# Patient Record
Sex: Female | Born: 1985 | Race: Black or African American | Hispanic: No | Marital: Single | Smoking: Never smoker
Health system: Southern US, Community
[De-identification: ages and names within clinical notes are randomized; demographics above are authoritative.]

## PROBLEM LIST (undated history)

## (undated) DIAGNOSIS — J45909 Unspecified asthma, uncomplicated: Secondary | ICD-10-CM

## (undated) DIAGNOSIS — N83299 Other ovarian cyst, unspecified side: Secondary | ICD-10-CM

## (undated) DIAGNOSIS — O43199 Other malformation of placenta, unspecified trimester: Secondary | ICD-10-CM

## (undated) HISTORY — DX: Other ovarian cyst, unspecified side: N83.299

## (undated) HISTORY — DX: Other malformation of placenta, unspecified trimester: O43.199

---

## 2016-07-17 ENCOUNTER — Emergency Department (HOSPITAL_COMMUNITY)
Admission: EM | Admit: 2016-07-17 | Discharge: 2016-07-17 | Disposition: A | Discharge status: Home / Self Care | Payer: Self-pay | Attending: Emergency Medicine | Admitting: Emergency Medicine

## 2016-07-17 ENCOUNTER — Emergency Department (HOSPITAL_COMMUNITY): Payer: Self-pay

## 2016-07-17 ENCOUNTER — Encounter (HOSPITAL_COMMUNITY): Payer: Self-pay | Admitting: Emergency Medicine

## 2016-07-17 DIAGNOSIS — J45909 Unspecified asthma, uncomplicated: Secondary | ICD-10-CM | POA: Insufficient documentation

## 2016-07-17 DIAGNOSIS — R0789 Other chest pain: Secondary | ICD-10-CM | POA: Insufficient documentation

## 2016-07-17 HISTORY — DX: Unspecified asthma, uncomplicated: J45.909

## 2016-07-17 LAB — BASIC METABOLIC PANEL
Anion gap: 8 (ref 5–15)
BUN: 10 mg/dL (ref 6–20)
CO2: 26 mmol/L (ref 22–32)
CREATININE: 0.64 mg/dL (ref 0.44–1.00)
Calcium: 9.4 mg/dL (ref 8.9–10.3)
Chloride: 102 mmol/L (ref 101–111)
GFR calc Af Amer: >60 mL/min (ref >60)
Glucose, Bld: 94 mg/dL (ref 65–99)
POTASSIUM: 3.9 mmol/L (ref 3.5–5.1)
SODIUM: 136 mmol/L (ref 135–145)

## 2016-07-17 LAB — CBC
HCT: 38.6 % (ref 36.0–46.0)
Hemoglobin: 12.8 g/dL (ref 12.0–15.0)
MCH: 29.9 pg (ref 26.0–34.0)
MCHC: 33.2 g/dL (ref 30.0–36.0)
MCV: 90.2 fL (ref 78.0–100.0)
PLATELETS: 414 10*3/uL — AB (ref 150–400)
RBC: 4.28 MIL/uL (ref 3.87–5.11)
RDW: 12.6 % (ref 11.5–15.5)
WBC: 4.6 10*3/uL (ref 4.0–10.5)

## 2016-07-17 LAB — D-DIMER, QUANTITATIVE (NOT AT ARMC)

## 2016-07-17 LAB — I-STAT TROPONIN, ED: TROPONIN I, POC: 0.02 ng/mL (ref 0.00–0.08)

## 2016-07-17 MED ORDER — OMEPRAZOLE 20 MG PO CPDR: 20.0000 mg | DELAYED_RELEASE_CAPSULE | Freq: Two times a day (BID) | ORAL | 0 refills | Status: DC

## 2016-07-17 NOTE — ED Provider Notes (Signed)
[facility]-EMERGENCY DEPT Provider Note   CSN: [id number] Arrival date & time: 07/17/16  1052     History   Chief Complaint Chief Complaint  Patient presents with  . Chest Pain    HPI Isabella Stevens is a 30 y.o. female.  The history is provided by the patient. No language interpreter was used.  Chest Pain     Isabella Stevens is a 30 y.o. female who presents to the Emergency Department complaining of chest pain.  She reports 3 weeks of left-sided chest pain. The pain is waxing and waning with no clear alleviating factors and no triggers. Over the last few days her pain has been more intense and occurring more frequently and she has associated shortness of breath along with the pain for the last 2 days. She denies any fevers, cough, abdominal pain, nausea, vomiting, diarrhea, leg swelling or pain. 3 weeks ago she did have an IUD removed. Past Medical History:  Diagnosis Date  . Asthma     There are no active problems to display for this patient.   History reviewed. No pertinent surgical history.  OB History    No data available       Home Medications    Prior to Admission medications   Medication Sig Start Date End Date Taking? Authorizing Provider  omeprazole (PRILOSEC) 20 MG capsule Take 1 capsule (20 mg total) by mouth 2 (two) times daily before a meal. 07/17/16   Tilden Fossa, MD    Family History History reviewed. No pertinent family history.  Social History Social History  Substance Use Topics  . Smoking status: Never Smoker  . Smokeless tobacco: Never Used  . Alcohol use Yes     Comment: socially     Allergies   Sulfa antibiotics   Review of Systems Review of Systems  Cardiovascular: Positive for chest pain.  All other systems reviewed and are negative.    Physical Exam Updated Vital Signs BP 120/70   Pulse 80   Temp 98.6 F (37 C) (Oral)   Resp 16   Ht 5\' 2"  (1.575 m)   Wt 220 lb (99.8 kg)   SpO2 100%   BMI 40.24 kg/m   Physical Exam    Constitutional: She is oriented to person, place, and time. She appears well-developed and well-nourished.  HENT:  Head: Normocephalic and atraumatic.  Cardiovascular: Normal rate and regular rhythm.   No murmur heard. Pulmonary/Chest: Effort normal and breath sounds normal. No respiratory distress. She exhibits no tenderness.  Abdominal: Soft. There is no tenderness. There is no rebound and no guarding.  Musculoskeletal: She exhibits no edema or tenderness.  Neurological: She is alert and oriented to person, place, and time.  Skin: Skin is warm and dry.  Psychiatric: She has a normal mood and affect. Her behavior is normal.  Nursing note and vitals reviewed.    ED Treatments / Results  Labs (all labs ordered are listed, but only abnormal results are displayed) Labs Reviewed  CBC - Abnormal; Notable for the following:       Result Value   Platelets 414 (*)    All other components within normal limits  BASIC METABOLIC PANEL  D-DIMER, QUANTITATIVE (NOT AT [facility])  I-STAT TROPOININ, ED    EKG  EKG Interpretation  Date/Time:  Saturday July 17 2016 10:59:06 EDT Ventricular Rate:  90 PR Interval:  132 QRS Duration: 82 QT Interval:  356 QTC Calculation: 435 R Axis:   55 Text Interpretation:  Normal sinus rhythm  Normal ECG Confirmed by Lincoln Brighamees, Liz [phone]([id number]) on 07/17/2016 11:12:49 AM Also confirmed by Lincoln Brighamees, Liz [phone]([id number]), editor [place]  CCT, BEVERLY ([id number])  on 07/17/2016 12:24:27 PM       Radiology Dg Chest 2 View  Result Date: 07/17/2016 CLINICAL DATA:  Left upper chest pain. EXAM: CHEST  2 VIEW COMPARISON:  None. FINDINGS: The heart size and mediastinal contours are within normal limits. Both lungs are clear. The visualized skeletal structures are unremarkable. IMPRESSION: No active cardiopulmonary disease. Electronically Signed   By: Gerome Samavid  Williams III M.D   On: 07/17/2016 12:08    Procedures Procedures (including critical care time)  Medications Ordered in  ED Medications - No data to display   Initial Impression / Assessment and Plan / ED Course  I have reviewed the triage vital signs and the nursing notes.  Pertinent labs & imaging results that were available during my care of the patient were reviewed by me and considered in my medical decision making (see chart for details).  Clinical Course    Patient here for evaluation of progressive left-sided chest pain. Pain is not reproducible on examination. Presentation is not consistent with ACS, PE, dissection. Discussed with patient's unclear etiology of pain but will treat for potential reflux. Discussed home care, outpatient follow-up, return precautions.  Final Clinical Impressions(s) / ED Diagnoses   Final diagnoses:  Atypical chest pain    New Prescriptions Discharge Medication List as of 07/17/2016  1:31 PM    START taking these medications   Details  omeprazole (PRILOSEC) 20 MG capsule Take 1 capsule (20 mg total) by mouth 2 (two) times daily before a meal., Starting Sat 07/17/2016, Print         Tilden FossaElizabeth Abdoul Encinas, MD 07/17/16 1724

## 2016-07-17 NOTE — ED Notes (Signed)
Pt getting dressed and discharge vitals in.  

## 2016-07-17 NOTE — ED Notes (Signed)
Pt transported to xray by Larrie KassMaurice Load holt

## 2016-07-17 NOTE — ED Triage Notes (Signed)
Pt states she has been having CP for the past 3 days. Today she has felt SOB as well and the CP has increased. Pt states the pain is in her shoulder, armpit and under her left breast.

## 2019-11-30 LAB — OB RESULTS CONSOLE ABO/RH: RH Type: POSITIVE

## 2019-11-30 LAB — OB RESULTS CONSOLE ANTIBODY SCREEN: Antibody Screen: NEGATIVE

## 2019-11-30 LAB — OB RESULTS CONSOLE RUBELLA ANTIBODY, IGM: Rubella: IMMUNE

## 2019-11-30 LAB — OB RESULTS CONSOLE HIV ANTIBODY (ROUTINE TESTING): HIV: NONREACTIVE

## 2019-11-30 LAB — OB RESULTS CONSOLE HEPATITIS B SURFACE ANTIGEN: Hepatitis B Surface Ag: NEGATIVE

## 2019-11-30 LAB — OB RESULTS CONSOLE RPR: RPR: NONREACTIVE

## 2020-05-23 LAB — OB RESULTS CONSOLE GBS: GBS: NEGATIVE

## 2020-06-10 ENCOUNTER — Other Ambulatory Visit: Payer: Self-pay

## 2020-06-10 ENCOUNTER — Encounter (HOSPITAL_COMMUNITY): Payer: Self-pay | Admitting: *Deleted

## 2020-06-10 ENCOUNTER — Telehealth (HOSPITAL_COMMUNITY): Payer: Self-pay | Admitting: *Deleted

## 2020-06-10 ENCOUNTER — Encounter (HOSPITAL_COMMUNITY): Payer: Self-pay | Admitting: Obstetrics and Gynecology

## 2020-06-10 ENCOUNTER — Inpatient Hospital Stay (HOSPITAL_COMMUNITY)
Admission: AD | Admit: 2020-06-10 | Discharge: 2020-06-14 | DRG: 787 | Disposition: A | Discharge status: Home / Self Care | Payer: 59 | Attending: Obstetrics and Gynecology | Admitting: Obstetrics and Gynecology

## 2020-06-10 DIAGNOSIS — Z98891 History of uterine scar from previous surgery: Secondary | ICD-10-CM

## 2020-06-10 DIAGNOSIS — D62 Acute posthemorrhagic anemia: Secondary | ICD-10-CM | POA: Diagnosis not present

## 2020-06-10 DIAGNOSIS — Z3A38 38 weeks gestation of pregnancy: Secondary | ICD-10-CM | POA: Diagnosis not present

## 2020-06-10 DIAGNOSIS — R030 Elevated blood-pressure reading, without diagnosis of hypertension: Secondary | ICD-10-CM | POA: Diagnosis present

## 2020-06-10 DIAGNOSIS — O3483 Maternal care for other abnormalities of pelvic organs, third trimester: Secondary | ICD-10-CM | POA: Diagnosis present

## 2020-06-10 DIAGNOSIS — O4103X0 Oligohydramnios, third trimester, not applicable or unspecified: Principal | ICD-10-CM | POA: Diagnosis present

## 2020-06-10 DIAGNOSIS — Z20822 Contact with and (suspected) exposure to covid-19: Secondary | ICD-10-CM | POA: Diagnosis present

## 2020-06-10 DIAGNOSIS — O358XX0 Maternal care for other (suspected) fetal abnormality and damage, not applicable or unspecified: Secondary | ICD-10-CM | POA: Diagnosis present

## 2020-06-10 DIAGNOSIS — N83209 Unspecified ovarian cyst, unspecified side: Secondary | ICD-10-CM | POA: Diagnosis present

## 2020-06-10 DIAGNOSIS — O4100X0 Oligohydramnios, unspecified trimester, not applicable or unspecified: Secondary | ICD-10-CM

## 2020-06-10 DIAGNOSIS — O99893 Other specified diseases and conditions complicating puerperium: Secondary | ICD-10-CM | POA: Diagnosis present

## 2020-06-10 DIAGNOSIS — O9081 Anemia of the puerperium: Secondary | ICD-10-CM | POA: Diagnosis not present

## 2020-06-10 DIAGNOSIS — Z3490 Encounter for supervision of normal pregnancy, unspecified, unspecified trimester: Secondary | ICD-10-CM

## 2020-06-10 LAB — CBC
HCT: 32.7 % — ABNORMAL LOW (ref 36.0–46.0)
Hemoglobin: 10.8 g/dL — ABNORMAL LOW (ref 12.0–15.0)
MCH: 30.9 pg (ref 26.0–34.0)
MCHC: 33 g/dL (ref 30.0–36.0)
MCV: 93.4 fL (ref 80.0–100.0)
Platelets: 376 10*3/uL (ref 150–400)
RBC: 3.5 MIL/uL — ABNORMAL LOW (ref 3.87–5.11)
RDW: 13.3 % (ref 11.5–15.5)
WBC: 6.8 10*3/uL (ref 4.0–10.5)
nRBC: 0 % (ref 0.0–0.2)

## 2020-06-10 LAB — TYPE AND SCREEN
ABO/RH(D): O POS
Antibody Screen: NEGATIVE

## 2020-06-10 LAB — SARS CORONAVIRUS 2 BY RT PCR (HOSPITAL ORDER, PERFORMED IN ~~LOC~~ HOSPITAL LAB): SARS Coronavirus 2: NEGATIVE

## 2020-06-10 MED ORDER — ACETAMINOPHEN 325 MG PO TABS: 650.0000 mg | ORAL_TABLET | ORAL | Status: DC | PRN

## 2020-06-10 MED ORDER — LIDOCAINE HCL (PF) 1 % IJ SOLN: 30.0000 mL | INTRAMUSCULAR | Status: DC | PRN

## 2020-06-10 MED ORDER — FLEET ENEMA 7-19 GM/118ML RE ENEM: 1.0000 | ENEMA | RECTAL | Status: DC | PRN

## 2020-06-10 MED ORDER — MISOPROSTOL 100 MCG PO TABS
25.0000 ug | ORAL_TABLET | ORAL | Status: DC
  Administered 2020-06-10 – 2020-06-11 (×3): 25 ug via VAGINAL
  Filled 2020-06-10 (×3): qty 1

## 2020-06-10 MED ORDER — SOD CITRATE-CITRIC ACID 500-334 MG/5ML PO SOLN
30.0000 mL | ORAL | Status: DC | PRN
  Administered 2020-06-11: 30 mL via ORAL
  Filled 2020-06-10: qty 30

## 2020-06-10 MED ORDER — LACTATED RINGERS IV SOLN
500.0000 mL | INTRAVENOUS | Status: DC | PRN
  Administered 2020-06-10: 250 mL via INTRAVENOUS

## 2020-06-10 MED ORDER — ONDANSETRON HCL 4 MG/2ML IJ SOLN: 4.0000 mg | Freq: Four times a day (QID) | INTRAMUSCULAR | Status: DC | PRN

## 2020-06-10 MED ORDER — LACTATED RINGERS IV SOLN: INTRAVENOUS | Status: DC

## 2020-06-10 MED ORDER — OXYCODONE-ACETAMINOPHEN 5-325 MG PO TABS: 1.0000 | ORAL_TABLET | ORAL | Status: DC | PRN

## 2020-06-10 MED ORDER — FENTANYL CITRATE (PF) 100 MCG/2ML IJ SOLN
100.0000 ug | INTRAMUSCULAR | Status: DC | PRN
  Administered 2020-06-11 (×3): 100 ug via INTRAVENOUS
  Filled 2020-06-10 (×3): qty 2

## 2020-06-10 MED ORDER — OXYCODONE-ACETAMINOPHEN 5-325 MG PO TABS: 2.0000 | ORAL_TABLET | ORAL | Status: DC | PRN

## 2020-06-10 MED ORDER — OXYTOCIN BOLUS FROM INFUSION: 333.0000 mL | Freq: Once | INTRAVENOUS | Status: DC

## 2020-06-10 MED ORDER — OXYTOCIN-SODIUM CHLORIDE 30-0.9 UT/500ML-% IV SOLN
2.5000 [IU]/h | INTRAVENOUS | Status: DC
  Filled 2020-06-10: qty 500

## 2020-06-10 NOTE — H&P (Signed)
Isabella Stevens is a 34 y.o. female presenting for IOL for oligo afi 3 today in office. OB History    Gravida  1   Para      Term      Preterm      AB      Living        SAB      TAB      Ectopic      Multiple      Live Births             Past Medical History:  Diagnosis Date  . Complex ovarian cyst   . Marginal insertion of umbilical cord affecting management of mother    History reviewed. No pertinent surgical history. Family History: family history includes Anxiety disorder in her mother; Arthritis in her mother; Cancer in her maternal grandmother; Diabetes in her maternal aunt; Heart disease in her maternal aunt; Hypertension in her maternal aunt and mother; Kidney disease in her maternal aunt; Obesity in her maternal aunt and mother; Stroke in her maternal grandmother. Social History:  reports that she has never smoked. She has never used smokeless tobacco. She reports current alcohol use. She reports that she does not use drugs.     Maternal Diabetes: No Genetic Screening: Normal Maternal Ultrasounds/Referrals: Fetal renal pyelectasis Fetal Ultrasounds or other Referrals:  None Maternal Substance Abuse:  No Significant Maternal Medications:  None Significant Maternal Lab Results:  Group B Strep negative Other Comments:  None  Review of Systems  Constitutional: Negative.   All other systems reviewed and are negative.  Maternal Medical History:  Fetal activity: Perceived fetal activity is decreased.   Last perceived fetal movement was within the past 12 hours.    Prenatal complications: Oligohydramnios.   Prenatal Complications - Diabetes: none.    Dilation: 1 Effacement (%): 30 Station: -3 Exam by:: Jacqulyn Liner, RN Blood pressure (!) 125/53, pulse 82, temperature 98.5 F (36.9 C), temperature source Oral, resp. rate 16. Maternal Exam:  Uterine Assessment: Contraction strength is mild.  Contraction frequency is irregular.   Abdomen: Patient  reports no abdominal tenderness. Fetal presentation: vertex  Introitus: Normal vulva. Normal vagina.  Ferning test: not done.  Nitrazine test: not done. Amniotic fluid character: not assessed.  Pelvis: adequate for delivery.   Cervix: Cervix evaluated by digital exam.     Physical Exam Constitutional:      Appearance: Normal appearance.  HENT:     Head: Normocephalic and atraumatic.  Cardiovascular:     Rate and Rhythm: Normal rate and regular rhythm.     Pulses: Normal pulses.     Heart sounds: Normal heart sounds.  Pulmonary:     Effort: Pulmonary effort is normal.     Breath sounds: Normal breath sounds.  Abdominal:     Palpations: Abdomen is soft.  Genitourinary:    General: Normal vulva.  Musculoskeletal:        General: Normal range of motion.     Cervical back: Normal range of motion and neck supple.  Skin:    General: Skin is warm and dry.  Neurological:     General: No focal deficit present.     Mental Status: She is alert and oriented to person, place, and time.  Psychiatric:        Mood and Affect: Mood normal.        Behavior: Behavior normal.     Prenatal labs: ABO, Rh: --/--/O POS (09/07 1840) Antibody: NEG (09/07 1840)  Rubella: Immune (02/26 0000) RPR: Nonreactive (02/26 0000)  HBsAg: Negative (02/26 0000)  HIV: Non-reactive (02/26 0000)  GBS: Negative/-- (08/20 0000)   Assessment/Plan: 38+ week IUP Oligohydramnios Left fetal pyelectasis IOL Neonatal renal fu FU sono for maternal ov cyst  Tierre Gerard J 06/10/2020, 8:36 PM

## 2020-06-10 NOTE — Telephone Encounter (Signed)
Preadmission screen  

## 2020-06-11 ENCOUNTER — Encounter (HOSPITAL_COMMUNITY)
Admission: AD | Disposition: A | Discharge status: Home / Self Care | Payer: Self-pay | Source: Home / Self Care | Attending: Obstetrics and Gynecology

## 2020-06-11 ENCOUNTER — Inpatient Hospital Stay (HOSPITAL_COMMUNITY): Payer: 59 | Admitting: Anesthesiology

## 2020-06-11 DIAGNOSIS — O4100X0 Oligohydramnios, unspecified trimester, not applicable or unspecified: Secondary | ICD-10-CM

## 2020-06-11 DIAGNOSIS — Z3490 Encounter for supervision of normal pregnancy, unspecified, unspecified trimester: Secondary | ICD-10-CM

## 2020-06-11 DIAGNOSIS — Z98891 History of uterine scar from previous surgery: Secondary | ICD-10-CM

## 2020-06-11 LAB — RPR: RPR Ser Ql: NONREACTIVE

## 2020-06-11 SURGERY — Surgical Case
Anesthesia: Epidural

## 2020-06-11 MED ORDER — SODIUM CHLORIDE (PF) 0.9 % IJ SOLN
INTRAMUSCULAR | Status: DC | PRN
Start: 2020-06-11 — End: 2020-06-11
  Administered 2020-06-11: 12 mL/h via EPIDURAL

## 2020-06-11 MED ORDER — SCOPOLAMINE 1 MG/3DAYS TD PT72: 1.0000 | MEDICATED_PATCH | Freq: Once | TRANSDERMAL | Status: DC

## 2020-06-11 MED ORDER — OXYTOCIN-SODIUM CHLORIDE 30-0.9 UT/500ML-% IV SOLN: 2.5000 [IU]/h | INTRAVENOUS | Status: AC

## 2020-06-11 MED ORDER — NALOXONE HCL 4 MG/10ML IJ SOLN
1.0000 ug/kg/h | INTRAVENOUS | Status: DC | PRN
  Filled 2020-06-11: qty 5

## 2020-06-11 MED ORDER — DIPHENHYDRAMINE HCL 50 MG/ML IJ SOLN: 12.5000 mg | INTRAMUSCULAR | Status: DC | PRN

## 2020-06-11 MED ORDER — SCOPOLAMINE 1 MG/3DAYS TD PT72
MEDICATED_PATCH | TRANSDERMAL | Status: DC | PRN
  Administered 2020-06-11: 1 via TRANSDERMAL

## 2020-06-11 MED ORDER — MEPERIDINE HCL 25 MG/ML IJ SOLN
INTRAMUSCULAR | Status: AC
  Filled 2020-06-11: qty 2

## 2020-06-11 MED ORDER — COCONUT OIL OIL
1.0000 "application " | TOPICAL_OIL | Status: DC | PRN
  Administered 2020-06-11: 1 via TOPICAL

## 2020-06-11 MED ORDER — MENTHOL 3 MG MT LOZG: 1.0000 | LOZENGE | OROMUCOSAL | Status: DC | PRN

## 2020-06-11 MED ORDER — DEXTROSE 5 % IV SOLN
INTRAVENOUS | Status: DC | PRN
  Administered 2020-06-11: 3 g via INTRAVENOUS

## 2020-06-11 MED ORDER — KETOROLAC TROMETHAMINE 30 MG/ML IJ SOLN
30.0000 mg | Freq: Four times a day (QID) | INTRAMUSCULAR | Status: AC | PRN
  Administered 2020-06-11: 30 mg via INTRAMUSCULAR

## 2020-06-11 MED ORDER — DIPHENHYDRAMINE HCL 25 MG PO CAPS
25.0000 mg | ORAL_CAPSULE | ORAL | Status: DC | PRN
  Administered 2020-06-12 (×3): 25 mg via ORAL
  Filled 2020-06-11 (×2): qty 1

## 2020-06-11 MED ORDER — PHENYLEPHRINE 40 MCG/ML (10ML) SYRINGE FOR IV PUSH (FOR BLOOD PRESSURE SUPPORT): 80.0000 ug | PREFILLED_SYRINGE | INTRAVENOUS | Status: DC | PRN

## 2020-06-11 MED ORDER — LIDOCAINE HCL (PF) 1 % IJ SOLN
INTRAMUSCULAR | Status: DC | PRN
  Administered 2020-06-11: 5 mL via EPIDURAL

## 2020-06-11 MED ORDER — MORPHINE SULFATE (PF) 0.5 MG/ML IJ SOLN
INTRAMUSCULAR | Status: AC
  Filled 2020-06-11: qty 10

## 2020-06-11 MED ORDER — SIMETHICONE 80 MG PO CHEW
80.0000 mg | CHEWABLE_TABLET | Freq: Three times a day (TID) | ORAL | Status: DC
  Administered 2020-06-12 – 2020-06-14 (×7): 80 mg via ORAL
  Filled 2020-06-11 (×7): qty 1

## 2020-06-11 MED ORDER — LACTATED RINGERS IV SOLN
500.0000 mL | Freq: Once | INTRAVENOUS | Status: AC
  Administered 2020-06-11: 500 mL via INTRAVENOUS

## 2020-06-11 MED ORDER — IBUPROFEN 800 MG PO TABS
800.0000 mg | ORAL_TABLET | Freq: Three times a day (TID) | ORAL | Status: DC
  Administered 2020-06-12 – 2020-06-14 (×5): 800 mg via ORAL
  Filled 2020-06-11 (×5): qty 1

## 2020-06-11 MED ORDER — LABETALOL HCL 5 MG/ML IV SOLN
20.0000 mg | Freq: Once | INTRAVENOUS | Status: AC
  Administered 2020-06-11: 20 mg via INTRAVENOUS

## 2020-06-11 MED ORDER — NALBUPHINE HCL 10 MG/ML IJ SOLN: 5.0000 mg | INTRAMUSCULAR | Status: DC | PRN

## 2020-06-11 MED ORDER — TERBUTALINE SULFATE 1 MG/ML IJ SOLN: 0.2500 mg | Freq: Once | INTRAMUSCULAR | Status: DC | PRN

## 2020-06-11 MED ORDER — DEXAMETHASONE SODIUM PHOSPHATE 4 MG/ML IJ SOLN
INTRAMUSCULAR | Status: AC
  Filled 2020-06-11: qty 1

## 2020-06-11 MED ORDER — WITCH HAZEL-GLYCERIN EX PADS: 1.0000 "application " | MEDICATED_PAD | CUTANEOUS | Status: DC | PRN

## 2020-06-11 MED ORDER — KETOROLAC TROMETHAMINE 30 MG/ML IJ SOLN
INTRAMUSCULAR | Status: AC
  Filled 2020-06-11: qty 1

## 2020-06-11 MED ORDER — MEPERIDINE HCL 25 MG/ML IJ SOLN
INTRAMUSCULAR | Status: DC | PRN
Start: 2020-06-11 — End: 2020-06-11
  Administered 2020-06-11: 12.5 mg via INTRAVENOUS

## 2020-06-11 MED ORDER — PRENATAL MULTIVITAMIN CH
1.0000 | ORAL_TABLET | Freq: Every day | ORAL | Status: DC
  Administered 2020-06-12 – 2020-06-13 (×2): 1 via ORAL
  Filled 2020-06-11 (×2): qty 1

## 2020-06-11 MED ORDER — ACETAMINOPHEN 500 MG PO TABS
1000.0000 mg | ORAL_TABLET | Freq: Four times a day (QID) | ORAL | Status: AC
  Administered 2020-06-12 (×2): 1000 mg via ORAL
  Filled 2020-06-11 (×2): qty 2

## 2020-06-11 MED ORDER — KETOROLAC TROMETHAMINE 30 MG/ML IJ SOLN
30.0000 mg | Freq: Four times a day (QID) | INTRAMUSCULAR | Status: AC | PRN
  Administered 2020-06-12 (×2): 30 mg via INTRAVENOUS
  Filled 2020-06-11 (×2): qty 1

## 2020-06-11 MED ORDER — OXYTOCIN-SODIUM CHLORIDE 30-0.9 UT/500ML-% IV SOLN
1.0000 m[IU]/min | INTRAVENOUS | Status: DC
  Administered 2020-06-11: 2 m[IU]/min via INTRAVENOUS
  Administered 2020-06-11: 30 [IU] via INTRAVENOUS
  Administered 2020-06-11: 10 m[IU]/min via INTRAVENOUS

## 2020-06-11 MED ORDER — IBUPROFEN 800 MG PO TABS: 800.0000 mg | ORAL_TABLET | Freq: Three times a day (TID) | ORAL | Status: DC

## 2020-06-11 MED ORDER — METOCLOPRAMIDE HCL 5 MG/ML IJ SOLN
INTRAMUSCULAR | Status: AC
  Filled 2020-06-11: qty 2

## 2020-06-11 MED ORDER — SODIUM CHLORIDE 0.9% FLUSH: 3.0000 mL | INTRAVENOUS | Status: DC | PRN

## 2020-06-11 MED ORDER — ONDANSETRON HCL 4 MG/2ML IJ SOLN
4.0000 mg | Freq: Three times a day (TID) | INTRAMUSCULAR | Status: DC | PRN
  Administered 2020-06-11: 4 mg via INTRAVENOUS
  Filled 2020-06-11: qty 2

## 2020-06-11 MED ORDER — NALBUPHINE HCL 10 MG/ML IJ SOLN: 5.0000 mg | Freq: Once | INTRAMUSCULAR | Status: DC | PRN

## 2020-06-11 MED ORDER — LACTATED RINGERS IV SOLN: INTRAVENOUS | Status: DC

## 2020-06-11 MED ORDER — NALOXONE HCL 0.4 MG/ML IJ SOLN: 0.4000 mg | INTRAMUSCULAR | Status: DC | PRN

## 2020-06-11 MED ORDER — SIMETHICONE 80 MG PO CHEW
80.0000 mg | CHEWABLE_TABLET | ORAL | Status: DC
  Administered 2020-06-12 – 2020-06-13 (×2): 80 mg via ORAL
  Filled 2020-06-11 (×2): qty 1

## 2020-06-11 MED ORDER — SENNOSIDES-DOCUSATE SODIUM 8.6-50 MG PO TABS
2.0000 | ORAL_TABLET | ORAL | Status: DC
  Administered 2020-06-12 – 2020-06-13 (×2): 2 via ORAL
  Filled 2020-06-11 (×2): qty 2

## 2020-06-11 MED ORDER — DIPHENHYDRAMINE HCL 25 MG PO CAPS
25.0000 mg | ORAL_CAPSULE | Freq: Four times a day (QID) | ORAL | Status: DC | PRN
  Filled 2020-06-11: qty 1

## 2020-06-11 MED ORDER — SCOPOLAMINE 1 MG/3DAYS TD PT72
MEDICATED_PATCH | TRANSDERMAL | Status: AC
  Filled 2020-06-11: qty 1

## 2020-06-11 MED ORDER — LIDOCAINE HCL (PF) 2 % IJ SOLN
INTRAMUSCULAR | Status: AC
  Filled 2020-06-11: qty 20

## 2020-06-11 MED ORDER — EPHEDRINE 5 MG/ML INJ: 10.0000 mg | INTRAVENOUS | Status: DC | PRN

## 2020-06-11 MED ORDER — PROMETHAZINE HCL 25 MG/ML IJ SOLN: 6.2500 mg | INTRAMUSCULAR | Status: DC | PRN

## 2020-06-11 MED ORDER — FENTANYL-BUPIVACAINE-NACL 0.5-0.125-0.9 MG/250ML-% EP SOLN: 12.0000 mL/h | EPIDURAL | Status: DC | PRN

## 2020-06-11 MED ORDER — MEPERIDINE HCL 25 MG/ML IJ SOLN: 6.2500 mg | INTRAMUSCULAR | Status: DC | PRN

## 2020-06-11 MED ORDER — MORPHINE SULFATE (PF) 0.5 MG/ML IJ SOLN
INTRAMUSCULAR | Status: DC | PRN
Start: 2020-06-11 — End: 2020-06-11
  Administered 2020-06-11: 3 mg via EPIDURAL
  Administered 2020-06-11 (×2): 1 mg via EPIDURAL

## 2020-06-11 MED ORDER — FENTANYL-BUPIVACAINE-NACL 0.5-0.125-0.9 MG/250ML-% EP SOLN
12.0000 mL/h | EPIDURAL | Status: DC | PRN
  Filled 2020-06-11: qty 250

## 2020-06-11 MED ORDER — EPINEPHRINE PF 1 MG/ML IJ SOLN
INTRAMUSCULAR | Status: AC
  Filled 2020-06-11: qty 1

## 2020-06-11 MED ORDER — SODIUM CHLORIDE 0.9 % IV SOLN: INTRAVENOUS | Status: DC | PRN

## 2020-06-11 MED ORDER — DIBUCAINE (PERIANAL) 1 % EX OINT: 1.0000 "application " | TOPICAL_OINTMENT | CUTANEOUS | Status: DC | PRN

## 2020-06-11 MED ORDER — PHENYLEPHRINE HCL (PRESSORS) 10 MG/ML IV SOLN
INTRAVENOUS | Status: DC | PRN
  Administered 2020-06-11: 120 ug via INTRAVENOUS
  Administered 2020-06-11: 80 ug via INTRAVENOUS

## 2020-06-11 MED ORDER — TETANUS-DIPHTH-ACELL PERTUSSIS 5-2.5-18.5 LF-MCG/0.5 IM SUSP: 0.5000 mL | Freq: Once | INTRAMUSCULAR | Status: DC

## 2020-06-11 MED ORDER — OXYCODONE HCL 5 MG PO TABS
5.0000 mg | ORAL_TABLET | ORAL | Status: DC | PRN
  Administered 2020-06-13 (×3): 5 mg via ORAL
  Filled 2020-06-11 (×3): qty 1

## 2020-06-11 MED ORDER — SIMETHICONE 80 MG PO CHEW: 80.0000 mg | CHEWABLE_TABLET | ORAL | Status: DC | PRN

## 2020-06-11 MED ORDER — METOCLOPRAMIDE HCL 5 MG/ML IJ SOLN
INTRAMUSCULAR | Status: DC | PRN
  Administered 2020-06-11: 10 mg via INTRAVENOUS

## 2020-06-11 MED ORDER — LABETALOL HCL 5 MG/ML IV SOLN
INTRAVENOUS | Status: AC
  Filled 2020-06-11: qty 4

## 2020-06-11 MED ORDER — DEXAMETHASONE SODIUM PHOSPHATE 4 MG/ML IJ SOLN
INTRAMUSCULAR | Status: DC | PRN
  Administered 2020-06-11: 4 mg via INTRAVENOUS

## 2020-06-11 MED ORDER — LACTATED RINGERS IV SOLN: 500.0000 mL | Freq: Once | INTRAVENOUS | Status: DC

## 2020-06-11 MED ORDER — NALBUPHINE HCL 10 MG/ML IJ SOLN
5.0000 mg | INTRAMUSCULAR | Status: DC | PRN
  Administered 2020-06-12 (×2): 5 mg via INTRAVENOUS
  Filled 2020-06-11 (×2): qty 1

## 2020-06-11 MED ORDER — ZOLPIDEM TARTRATE 5 MG PO TABS: 5.0000 mg | ORAL_TABLET | Freq: Every evening | ORAL | Status: DC | PRN

## 2020-06-11 MED ORDER — ONDANSETRON HCL 4 MG/2ML IJ SOLN
INTRAMUSCULAR | Status: DC | PRN
  Administered 2020-06-11: 4 mg via INTRAVENOUS

## 2020-06-11 MED ORDER — FENTANYL CITRATE (PF) 100 MCG/2ML IJ SOLN: 25.0000 ug | INTRAMUSCULAR | Status: DC | PRN

## 2020-06-11 SURGICAL SUPPLY — 36 items
BENZOIN TINCTURE PRP APPL 2/3 (GAUZE/BANDAGES/DRESSINGS) ×3 IMPLANT
CHLORAPREP W/TINT 26ML (MISCELLANEOUS) ×3 IMPLANT
CLAMP CORD UMBIL (MISCELLANEOUS) IMPLANT
CLOSURE STERI STRIP 1/2 X4 (GAUZE/BANDAGES/DRESSINGS) ×3 IMPLANT
CLOSURE WOUND 1/2 X4 (GAUZE/BANDAGES/DRESSINGS)
CLOTH BEACON ORANGE TIMEOUT ST (SAFETY) ×3 IMPLANT
DRSG OPSITE POSTOP 4X10 (GAUZE/BANDAGES/DRESSINGS) ×3 IMPLANT
ELECT REM PT RETURN 9FT ADLT (ELECTROSURGICAL) ×3
ELECTRODE REM PT RTRN 9FT ADLT (ELECTROSURGICAL) ×1 IMPLANT
EXTRACTOR VACUUM M CUP 4 TUBE (SUCTIONS) IMPLANT
EXTRACTOR VACUUM M CUP 4' TUBE (SUCTIONS)
GLOVE BIO SURGEON STRL SZ 6.5 (GLOVE) ×2 IMPLANT
GLOVE BIO SURGEONS STRL SZ 6.5 (GLOVE) ×1
GLOVE BIOGEL PI IND STRL 7.0 (GLOVE) ×2 IMPLANT
GLOVE BIOGEL PI INDICATOR 7.0 (GLOVE) ×4
GOWN STRL REUS W/TWL LRG LVL3 (GOWN DISPOSABLE) ×6 IMPLANT
KIT ABG SYR 3ML LUER SLIP (SYRINGE) IMPLANT
NEEDLE HYPO 22GX1.5 SAFETY (NEEDLE) IMPLANT
NEEDLE HYPO 25X5/8 SAFETYGLIDE (NEEDLE) IMPLANT
NS IRRIG 1000ML POUR BTL (IV SOLUTION) ×3 IMPLANT
PACK C SECTION WH (CUSTOM PROCEDURE TRAY) ×3 IMPLANT
PAD OB MATERNITY 4.3X12.25 (PERSONAL CARE ITEMS) ×3 IMPLANT
PENCIL SMOKE EVAC W/HOLSTER (ELECTROSURGICAL) ×3 IMPLANT
STRIP CLOSURE SKIN 1/2X4 (GAUZE/BANDAGES/DRESSINGS) IMPLANT
SUT MON AB 4-0 PS1 27 (SUTURE) ×3 IMPLANT
SUT PLAIN 0 NONE (SUTURE) IMPLANT
SUT PLAIN 2 0 XLH (SUTURE) IMPLANT
SUT VIC AB 0 CT1 36 (SUTURE) ×6 IMPLANT
SUT VIC AB 0 CTX 36 (SUTURE) ×4
SUT VIC AB 0 CTX36XBRD ANBCTRL (SUTURE) ×2 IMPLANT
SUT VIC AB 2-0 CT1 27 (SUTURE) ×2
SUT VIC AB 2-0 CT1 TAPERPNT 27 (SUTURE) ×1 IMPLANT
SYR CONTROL 10ML LL (SYRINGE) IMPLANT
TOWEL OR 17X24 6PK STRL BLUE (TOWEL DISPOSABLE) ×3 IMPLANT
TRAY FOLEY W/BAG SLVR 14FR LF (SET/KITS/TRAYS/PACK) IMPLANT
WATER STERILE IRR 1000ML POUR (IV SOLUTION) ×3 IMPLANT

## 2020-06-11 NOTE — Brief Op Note (Signed)
06/10/2020 - 06/11/2020  7:35 PM  PATIENT:  Jon Gills Lattin  34 y.o. female  PRE-OPERATIVE DIAGNOSIS:  Arrest of Dilation  POST-OPERATIVE DIAGNOSIS:  Arrest of Dilation  PROCEDURE:  Procedure(s): CESAREAN SECTION (N/A)  Primary low transverse cesarean section with 2 layer closure  SURGEON:  Surgeon(s) and Role:    Noland Fordyce, MD - Primary  PHYSICIAN ASSISTANT:   ASSISTANTS: Dorisann Frames, CNM  ANESTHESIA:   epidural  EBL:  316 mL   BLOOD ADMINISTERED:none  DRAINS: Urinary Catheter (Foley)   LOCAL MEDICATIONS USED:  NONE  SPECIMEN:  Source of Specimen:  Placenta  DISPOSITION OF SPECIMEN:  To labor and delivery for disposal  COUNTS:  YES  TOURNIQUET:  * No tourniquets in log *  DICTATION: .Note written in EPIC  PLAN OF CARE: Admit to inpatient   PATIENT DISPOSITION:  PACU - hemodynamically stable.   Delay start of Pharmacological VTE agent (>24hrs) due to surgical blood loss or risk of bleeding: yes

## 2020-06-11 NOTE — Progress Notes (Addendum)
S: Comfortable with epidural. Discussed the R/B/A of Cook catheter placement for cervical ripening. Patient consents to procedure. Expecting baby girl, "Bovey".   O: Vitals:   06/11/20 0810 06/11/20 0811 06/11/20 0815 06/11/20 0932  BP: 135/69 135/69 130/66 (!) 104/55  Pulse: 67 67 66 71  Resp: 16 18 16 18   Temp:      TempSrc:      SpO2: 100% 100% 100%   Weight:      Height:       FHT:  FHR: 150 bpm, variability: moderate,  accelerations:  Present,  decelerations:  Absent UC:   irregular, every 1.5-4 minutes SVE:   Dilation: 2.5 Effacement (%): 50 Station: -3 Exam by:: Seanna Sisler, cnm  Cook catheter placed without difficulty. 60cc placed in uterine balloon, 40cc in vaginal balloon  A / P: Induction of labor due to oligohydramnios, s/p cervical ripening with Cytotec  Fetal Wellbeing:  Category I Pain Control:  Epidural Anticipated MOD:  NSVD   Will start Pitocin 2x2 for contractions, stop at 14mu until [place] is expelled.   Dr. Luceni updated on patient status and plan of care.   Billy Coast, CNM, MSN 06/11/2020, 9:55 AM

## 2020-06-11 NOTE — Op Note (Signed)
06/10/2020 - 06/11/2020  7:35 PM  PATIENT:  Isabella Stevens  34 y.o. female  PRE-OPERATIVE DIAGNOSIS:  Arrest of Dilation  POST-OPERATIVE DIAGNOSIS:  Arrest of Dilation  PROCEDURE:  Procedure(s): CESAREAN SECTION (N/A)  Primary low transverse cesarean section with 2 layer closure  SURGEON:  Surgeon(s) and Role:    Noland Fordyce, MD - Primary  PHYSICIAN ASSISTANT:   ASSISTANTS: Dorisann Frames, CNM  ANESTHESIA:   epidural  EBL:  316 mL   BLOOD ADMINISTERED:none  DRAINS: Urinary Catheter (Foley)   LOCAL MEDICATIONS USED:  NONE  SPECIMEN:  Source of Specimen:  Placenta  DISPOSITION OF SPECIMEN:  To labor and delivery for disposal  COUNTS:  YES  TOURNIQUET:  * No tourniquets in log *  DICTATION: .Note written in EPIC  PLAN OF CARE: Admit to inpatient   PATIENT DISPOSITION:  PACU - hemodynamically stable.   Delay start of Pharmacological VTE agent (>24hrs) due to surgical blood loss or risk of bleeding: yes   Findings:  @BABYSEXEBC @ infant,  APGAR (1 MIN): 8   APGAR (5 MINS): 8   APGAR (10 MINS): 9   Normal uterus, tubes and ovaries, normal placenta. 3VC, clear amniotic fluid  EBL: Per nursing notes cc Antibiotics: 3 g Ancef Complications: none  Indications: This is a 34 y.o. year-old, G1 at [redacted]w[redacted]d admitted for induction of labor due to oligohydramnios.  Patient did not have a history of rupture of membranes.  Patient was admitted and received 3 doses of Cytotec overnight.  In the a.m. she had cervical Foley.  And started on Pitocin.  Later in the afternoon repeat exam when the Foley came out,  membranes were not intact.  Rupture time is unclear.  IUPC was placed given the subtle late decelerations.  Fetal strip remained adequate though decelerations continued but overall accelerations and excellent variability allowed to continue trial of labor.  By 6 PM IUPC was showing adequate [place] units but cervix remained at 3 cm 20% effaced and the vertex at a -3 station  with molding.  Pelvis was felt to be very narrow and inhibiting fetal descent.  In addition late decelerations were continuing.  Discussion with the patient was had regarding option of continuing trial of labor as long as fetal testing remained reassuring or moving to C-section with the clinical suspicion of cesarean section as the eventual outcome.  Patient opted to proceed with cesarean section now.  Risks benefits and alternatives of the procedure were discussed with the patient who agreed to proceed  Procedure:  After informed consent was obtained the patient was taken to the operating room where epidural anesthesia was found to be adequate.  She was prepped and draped in the normal sterile fashion in dorsal supine position with a leftward tilt.  A foley catheter was in place.  A Pfannenstiel skin incision was made 2 cm above the pubic symphysis in the midline with the scalpel.  Dissection was carried down with the Bovie cautery until the fascia was reached. The fascia was incised in the midline. The incision was extended laterally with the Mayo scissors. The inferior aspect of the fascial incision was grasped with the Coker clamps, elevated up and the underlying rectus muscles were dissected off sharply. The superior aspect of the fascial incision was grasped with the Coker clamps elevated up and the underlying rectus muscles were dissected off sharply.  The peritoneum was entered sharply. The peritoneal incision was extended superiorly and inferiorly with good visualization of the bladder. The bladder  blade was inserted and palpation was done to assess the fetal position and the location of the uterine vessels. The lower segment of the uterus was incised sharply with the scalpel and extended  bluntly in the cephalo-caudal fashion. The infant was grasped, brought to the incision,  rotated and the infant was delivered with fundal pressure. The nose and mouth were bulb suctioned. The cord was clamped and cut  after 1 minute delay. The infant was handed off to the waiting pediatrician. The placenta was expressed. The uterus was exteriorized. The uterus was cleared of all clots and debris. The uterine incision was repaired with 0 Vicryl in a running locked fashion.  A bleeding vessel was noted at the midpoint of the uterine incision a figure-of-eight suture was used to control hemostasis.  A second layer of the same suture was used in an imbricating fashion to obtain excellent hemostasis.  A hematoma was noted inferior right angle.  2 additional figure-of-eight sutures were used to compress this hematoma.  No further spread was noted.  The uterus was then returned to the abdomen, the gutters were cleared of all clots and debris. The uterine incision was reinspected and found to be hemostatic. The peritoneum was grasped and closed with 2-0 Vicryl in a running fashion. The cut muscle edges and the underside of the fascia were inspected and found to be hemostatic. The fascia was closed with 0 Vicryl in a single layer. The subcutaneous tissue was irrigated. Scarpa's layer was closed with a 2-0 plain gut suture. The skin was closed with a 4-0 Vicryl l in a single layer. The patient tolerated the procedure well. Sponge lap and needle counts were correct x3 and patient was taken to the recovery room in a stable condition.  Lendon Colonel 06/11/2020 7:37 PM

## 2020-06-11 NOTE — Progress Notes (Signed)
Late entry, pt seen around 3 p  S: Doing well, no complaints, pain well controlled with epidural, done early this am prior to foley bulb and start of pitocin. S/p cytotec x 3  O: BP 121/69   Pulse 79   Temp 98.1 F (36.7 C) (Oral)   Resp 16   Ht 5\' 2"  (1.575 m)   Wt 122.5 kg   SpO2 100%   BMI 49.38 kg/m    FHT:  FHR: 150s bpm, variability: moderate,  accelerations:  Present,  decelerations:  Present subtle late decels in some position, good variability within, about 20 seconds UC:   regular, every 2-3 minutes, recent hyperstim and pitocin cut in half SVE:   Dilation: 4 Effacement (%): 20 Station: -3 Exam by:: [email or web address], MD Attempt to AROM  But pt appears to have SROM, unclear timing of SROM.   A / P:  34 y.o.  OB History  Gravida Para Term Preterm AB Living  1 0 0 0 0 0  SAB TAB Ectopic Multiple Live Births  0 0 0 0 0   at [redacted]w[redacted]d IOL for oligohydramnios, s/p cytotec x 3, cervica foley and now on pitocin  Fetal Wellbeing:  Category II Pain Control:  Epidural  Anticipated MOD:  unclear, fetal decels noted, overall reactive fetus but narrow pelvis and baby remains in a high station  [redacted]w[redacted]d 06/11/2020, 5:18 PM

## 2020-06-11 NOTE — Anesthesia Procedure Notes (Signed)
Epidural Patient location during procedure: OB Start time: 06/11/2020 7:38 AM End time: 06/11/2020 7:44 AM  Staffing Anesthesiologist: Shelton Silvas, MD Performed: anesthesiologist   Preanesthetic Checklist Completed: patient identified, IV checked, site marked, risks and benefits discussed, surgical consent, monitors and equipment checked, pre-op evaluation and timeout performed  Epidural Patient position: sitting Prep: ChloraPrep Patient monitoring: heart rate, continuous pulse ox and blood pressure Approach: midline Location: L3-L4 Injection technique: LOR saline  Needle:  Needle type: Tuohy  Needle gauge: 17 G Needle length: 9 cm Catheter type: closed end flexible Catheter size: 20 Guage Test dose: negative and 1.5% lidocaine  Assessment Events: blood not aspirated, injection not painful, no injection resistance and no paresthesia  Additional Notes LOR @ 6  Patient identified. Risks/Benefits/Options discussed with patient including but not limited to bleeding, infection, nerve damage, paralysis, failed block, incomplete pain control, headache, blood pressure changes, nausea, vomiting, reactions to medications, itching and postpartum back pain. Confirmed with bedside nurse the patient's most recent platelet count. Confirmed with patient that they are not currently taking any anticoagulation, have any bleeding history or any family history of bleeding disorders. Patient expressed understanding and wished to proceed. All questions were answered. Sterile technique was used throughout the entire procedure. Please see nursing notes for vital signs. Test dose was given through epidural catheter and negative prior to continuing to dose epidural or start infusion. Warning signs of high block given to the patient including shortness of breath, tingling/numbness in hands, complete motor block, or any concerning symptoms with instructions to call for help. Patient was given instructions on  fall risk and not to get out of bed. All questions and concerns addressed with instructions to call with any issues or inadequate analgesia.    Reason for block:procedure for pain

## 2020-06-11 NOTE — Anesthesia Preprocedure Evaluation (Signed)
Anesthesia Evaluation  Patient identified by MRN, date of birth, ID band Patient awake    Reviewed: Allergy & Precautions, Patient's Chart, lab work & pertinent test results  Airway Mallampati: II  TM Distance: >3 FB Neck ROM: Full    Dental  (+) Teeth Intact   Pulmonary neg pulmonary ROS,    Pulmonary exam normal        Cardiovascular negative cardio ROS Normal cardiovascular exam     Neuro/Psych    GI/Hepatic GERD  Medicated,  Endo/Other    Renal/GU negative Renal ROS     Musculoskeletal   Abdominal (+) + obese,   Peds  Hematology negative hematology ROS (+)   Anesthesia Other Findings   Reproductive/Obstetrics (+) Pregnancy                             Anesthesia Physical Anesthesia Plan  ASA: III  Anesthesia Plan: Epidural   Post-op Pain Management:    Induction:   PONV Risk Score and Plan: 0  Airway Management Planned: Natural Airway  Additional Equipment: None  Intra-op Plan:   Post-operative Plan:   Informed Consent: I have reviewed the patients History and Physical, chart, labs and discussed the procedure including the risks, benefits and alternatives for the proposed anesthesia with the patient or authorized representative who has indicated his/her understanding and acceptance.       Plan Discussed with:   Anesthesia Plan Comments: (Lab Results      Component                Value               Date                      WBC                      6.8                 06/10/2020                HGB                      10.8 (L)            06/10/2020                HCT                      32.7 (L)            06/10/2020                MCV                      93.4                06/10/2020                PLT                      376                 06/10/2020            Covid-19 Nucleic Acid Test Results Lab Results      Component  Value                Date                      [id number]              NEGATIVE            06/10/2020           )        Anesthesia Quick Evaluation

## 2020-06-11 NOTE — Anesthesia Postprocedure Evaluation (Signed)
Anesthesia Post Note  Patient: Isabella Stevens  Procedure(s) Performed: CESAREAN SECTION (N/A )     Patient location during evaluation: PACU Anesthesia Type: Epidural Level of consciousness: oriented and awake and alert Pain management: pain level controlled Vital Signs Assessment: post-procedure vital signs reviewed and stable Respiratory status: spontaneous breathing and respiratory function stable Cardiovascular status: blood pressure returned to baseline and stable Postop Assessment: no headache, no backache and no apparent nausea or vomiting Anesthetic complications: no Comments: BP elevated intraop and post-operatively. Patient asymptomatic and in no pain. I advised PACU RN to make OB aware so that patient can be treated during her postoperative stay on the floor.    No complications documented.  Last Vitals:  Vitals:   06/11/20 2100 06/11/20 2102  BP: (!) 161/85 (!) 166/86  Pulse: 61 61  Resp: (!) 0 15  Temp:    SpO2: 97% 97%    Last Pain:  Vitals:   06/11/20 2030  TempSrc:   PainSc: 0-No pain   Pain Goal:    LLE Motor Response: Purposeful movement (06/11/20 2045) LLE Sensation: Full sensation (06/11/20 2045) RLE Motor Response: Purposeful movement (06/11/20 2045) RLE Sensation: Full sensation (06/11/20 2045)     Epidural/Spinal Function Cutaneous sensation: Normal sensation (06/11/20 2045), Patient able to flex knees: Yes (06/11/20 2045), Patient able to lift hips off bed: Yes (06/11/20 2045), Back pain beyond tenderness at insertion site: No (06/11/20 2045), Progressively worsening motor and/or sensory loss: No (06/11/20 2045), Bowel and/or bladder incontinence post epidural: No (06/11/20 2045)  Mellody Dance

## 2020-06-11 NOTE — Transfer of Care (Signed)
Immediate Anesthesia Transfer of Care Note  Patient: Isabella Stevens  Procedure(s) Performed: CESAREAN SECTION (N/A )  Patient Location: PACU  Anesthesia Type:Epidural  Level of Consciousness: awake, alert  and oriented  Airway & Oxygen Therapy: Patient Spontanous Breathing  Post-op Assessment: Report given to RN and Post -op Vital signs reviewed and stable  Post vital signs: Reviewed and stable  Last Vitals:  Vitals Value Taken Time  BP 147/76 06/11/20 1943  Temp    Pulse 76 06/11/20 1945  Resp 26 06/11/20 1945  SpO2 100 % 06/11/20 1945  Vitals shown include unvalidated device data.  Last Pain:  Vitals:   06/11/20 1616  TempSrc: Oral  PainSc:          Complications: No complications documented.

## 2020-06-12 ENCOUNTER — Encounter (HOSPITAL_COMMUNITY): Payer: Self-pay | Admitting: Obstetrics

## 2020-06-12 LAB — CBC
HCT: 31.1 % — ABNORMAL LOW (ref 36.0–46.0)
Hemoglobin: 10.3 g/dL — ABNORMAL LOW (ref 12.0–15.0)
MCH: 30.8 pg (ref 26.0–34.0)
MCHC: 33.1 g/dL (ref 30.0–36.0)
MCV: 93.1 fL (ref 80.0–100.0)
Platelets: 327 10*3/uL (ref 150–400)
RBC: 3.34 MIL/uL — ABNORMAL LOW (ref 3.87–5.11)
RDW: 13.4 % (ref 11.5–15.5)
WBC: 18.3 10*3/uL — ABNORMAL HIGH (ref 4.0–10.5)
nRBC: 0 % (ref 0.0–0.2)

## 2020-06-12 NOTE — Lactation Note (Signed)
This note was copied from a baby's chart. Lactation Consultation Note  Patient Name: Isabella Stevens [id number] Date: 06/12/2020 Reason for consult: Follow-up assessment;Primapara;1st time breastfeeding;Early term 37-38.6wks;Difficult latch;Other (Comment) (LC encouraged mom to call LC with feeding cues for Latch Assessment) Baby is 20 hours old  As LC entered the room mom eating lunch and aunt holding baby asleep.  Per mom baby was last fed formula in a cup 17 ml at 2:20 pm. No spitting.  Baby HNV , mom confirmed , several stools.  LC recommended to mom after she eats lunch to consider pumping with the DEBP to stimulate breast.  LC reminded mom to call with feeding cues for feeding assessment.    Maternal Data    Feeding Feeding Type:  (per mom baby recently fed at 1410 and was cup fed 17 ml)  LATCH Score                   Interventions Interventions: Breast feeding basics reviewed  Lactation Tools Discussed/Used     Consult Status Consult Status: Follow-up Date: 06/12/20 Follow-up type: In-patient    Isabella Stevens 06/12/2020, 2:53 PM

## 2020-06-12 NOTE — Progress Notes (Signed)
POSTOPERATIVE DAY # 1 S/P Primary LTCS for arrest of dilation, baby girl "[place]"  S:         Reports feeling well, no complaints              Tolerating po intake /(+) nausea and vomiting last night, but none today / no flatus / no BM  Denies dizziness, SOB, or CP             Bleeding is light             Pain controlled withTylenol and Toradol             Up with assistance/ RN in room to remove foley catheter  Newborn breast and formula feeding - working with lactation   O:  VS: BP 109/63 (BP Location: Left Arm)   Pulse 60   Temp 97.9 F (36.6 C) (Oral)   Resp 18   Ht 5\' 2"  (1.575 m)   Wt 122.5 kg   SpO2 100%   BMI 49.38 kg/m   Patient Vitals for the past 24 hrs:  BP Temp Temp src Pulse Resp SpO2  06/12/20 1245 109/63 97.9 F (36.6 C) Oral 60 18 100 %  06/12/20 0837 125/67 97.9 F (36.6 C) Oral 61 19 100 %  06/12/20 0443 -- 98.7 F (37.1 C) Oral -- 20 99 %  06/12/20 0242 -- -- -- -- 20 99 %  06/12/20 0028 (!) 145/74 98 F (36.7 C) Oral (!) 59 18 100 %  06/11/20 2326 133/78 -- -- 63 18 99 %  06/11/20 2228 139/79 98.4 F (36.9 C) Oral 66 18 100 %  06/11/20 2129 (!) 141/82 98.7 F (37.1 C) Oral 68 18 99 %  06/11/20 2115 (!) 158/83 -- -- 65 (!) 21 96 %  06/11/20 2102 (!) 166/86 -- -- 61 15 97 %  06/11/20 2100 (!) 161/85 -- -- 61 (!) 0 97 %  06/11/20 2045 (!) 159/80 -- -- 64 13 99 %  06/11/20 2030 (!) 169/82 98.4 F (36.9 C) -- 67 11 100 %  06/11/20 2019 (!) 164/84 -- -- 71 17 100 %  06/11/20 2015 (!) 166/88 -- -- 65 (!) 0 99 %  06/11/20 2000 (!) 159/87 -- -- 70 18 99 %  06/11/20 1945 (!) 141/80 98.5 F (36.9 C) -- 80 (!) 21 98 %  06/11/20 1701 121/69 -- -- 79 18 --  06/11/20 1632 (!) 109/52 -- -- 67 -- --  06/11/20 1616 -- 98.1 F (36.7 C) Oral -- -- --  06/11/20 1605 130/60 -- -- 68 -- --  06/11/20 1532 (!) 115/54 -- -- 100 -- --  06/11/20 1432 -- 98.5 F (36.9 C) Oral -- -- --  06/11/20 1401 121/60 -- -- 63 -- --  06/11/20 1332 (!) 113/56 -- -- 69 16 --      LABS:              Recent Labs    06/10/20 1849 06/12/20 0525  WBC 6.8 18.3*  HGB 10.8* 10.3*  PLT 376 327               Bloodtype: --/--/O POS (09/07 1840)  Rubella: Immune (02/26 0000)                                             I&O: Intake/Output  09/08 0701 - 09/09 0700 09/09 0701 - 09/10 0700   P.O. 180    I.V. (mL/kg) 2383 (19.5)    Other 0    Total Intake(mL/kg) 2563 (20.9)    Urine (mL/kg/hr) 4100 (1.4) 300 (0.4)   Emesis/NG output 400    Blood 916    Total Output 5416 300   Net -2853 -300                     Physical Exam:             Alert and Oriented X3  Lungs: Clear and unlabored  Heart: regular rate and rhythm / no murmurs  Abdomen: soft, non-tender,obese, moderate gaseous distention, active bowel sounds             Fundus: firm, non-tender, U-1             Dressing: honeycomb with steri-strips, scant drainage              Incision:  approximated with sutures / no erythema / no ecchymosis / no active drainage  Perineum: intact; foley catheter in place  Lochia: small lochia on pad  Extremities: trace LE edema, no calf pain or tenderness  A/P:     POD # 1 S/P Primary LTCS             ABL Anemia compounding chronic IDA   - begin Niferex 150mg  PO daily when bowel function improved   - Magnesium oxide 400mg  PO daily   Maternal ovarian cyst   - f/u PP  Labile BPs yesterday   - stable today; no neural s/s   - watch closely for PP HTN/ PEC; would recommend BP check in 1 week if remains stable    Routine postoperative care              Warm liquids and ambulation encouraged to promote bowel motility  Lactation support PRN   Continue current care   , MSN, CNM [facility]

## 2020-06-12 NOTE — Lactation Note (Signed)
This note was copied from a baby's chart. Lactation Consultation Note Baby 5 hrs old. Baby is in room w/mom. Baby suckling on hands showing hunger cues. Mom has Large pendulous breast. Breast has dependent pitting edema to breast. [place]ola is not compressible at all. Mom felt how swollen her breast are. Nipples flat. Reverse pressure w/little help.  Discussed plan of care w/mom on how to lessen the edema so baby can BF and get BM. Unable to express anything.  Fitted mom w/#24 NS. Latched baby in football hold. Baby BF well for 10 minutes. Nothing in NS but dried flaky skin.  Discussed w/mom need for supplementing, mom in agreement. Encouraged mom to wear shells in bra in am. Pump breast for stimulation and pull nipple out. Rub coconut oil to breast/areola to assist in softening breast tissue.  Cup fed baby 10 ml Similac 20 cal. W/foley cup. Gave mom supplemental sheet for BF and for formula feeding. Gave the one for formula feeding d/t baby isn't getting anything from the breast at this time d/t edema. Mom in understanding of this and LC gave lots of hope and expectation that once edema is lessened mom can BF and be able to transfer milk.  Mom had positive breast changes during pregnancy.  DEBP set up. Mom kept falling asleep while LC setting up pump.  LC demonstrated using hand pump before application of NS to show boarders of nipple for application of NS.  During BF mom kept falling asleep then as well.  After baby taken away and given to FOB mom projectile vomited across the room. A lot of clear liquid emesis. RN notified. LC cleaned up floor and bedside table w/towel. Gave mom wet wash cloth.  RN to medicate mom and assess. LC done teaching as baby was feeding. LC doesn't think mom was in any condition to absorb teaching.  PLAN: Mom is to wear shells in bra to assist in everting nipples. Pre-pump before applying NS. BF baby w/#24 NS FOB to supplement w/Similac after BF until  able to see colostrum being transferred or pumped. Mom to use DEBP for stimulation every 3 hrs.  Goal:  Be able to latch to breast w/o NS. Breast will be soft and compressible.  Be able to transfer milk from breast and not have to supplement w/formula.  Mom encouraged to call for assistance when latching. Lactation brochure given.  Patient Name: Girl Bentlee Drier [id number]'U Date: 06/12/2020 Reason for consult: Initial assessment;Primapara;Early term 37-38.6wks   Maternal Data Has patient been taught Hand Expression?: Yes Does the patient have breastfeeding experience prior to this delivery?: No  Feeding Feeding Type: Formula Nipple Type: Slow - flow  LATCH Score Latch: Grasps breast easily, tongue down, lips flanged, rhythmical sucking.  Audible Swallowing: None  Type of Nipple: Flat  Comfort (Breast/Nipple): Filling, red/small blisters or bruises, mild/mod discomfort (edema)  Hold (Positioning): Full assist, staff holds infant at breast  LATCH Score: 4  Interventions Interventions: Breast feeding basics reviewed;Breast compression;Assisted with latch;Adjust position;Skin to skin;Support pillows;DEBP;Breast massage;Position options;Hand express;Pre-pump if needed;Reverse pressure;Shells;Coconut oil  Lactation Tools Discussed/Used Tools: Pump;Shells;Nipple Shields;Coconut oil Nipple shield size: 24 Shell Type: Inverted Breast pump type: Double-Electric Breast Pump WIC Program: No Pump Review: Setup, frequency, and cleaning;Milk Storage Initiated by:: Peri Jefferson RN IBCLC Date initiated:: 06/12/20   Consult Status Consult Status: Follow-up Date: 06/12/20 Follow-up type: In-patient    Charyl Dancer 06/12/2020, 12:14 AM

## 2020-06-13 MED ORDER — SENNOSIDES-DOCUSATE SODIUM 8.6-50 MG PO TABS: 2.0000 | ORAL_TABLET | Freq: Every evening | ORAL | 0 refills | Status: AC | PRN

## 2020-06-13 MED ORDER — IBUPROFEN 800 MG PO TABS: 800.0000 mg | ORAL_TABLET | Freq: Three times a day (TID) | ORAL | 0 refills | Status: AC

## 2020-06-13 NOTE — Lactation Note (Signed)
This note was copied from a baby's chart. Lactation Consultation Note  Patient Name: Isabella Stevens ELFYB'O Date: 06/13/2020 Reason for consult: Follow-up assessment   Mother having difficulty with latching infant. She has been mostly cup feeding formula.  Assist mother with placing infant in football hold while sitting up in chair. Mother has large breast and semi-flat nipples.   Multiple attempts using tea-cup hold to latch infant but was unsuccessful. Infant on and off for a few suck but no sustained latch.   Mother was fit with a #24 NS . Infant latched well with help to widen gape. Infant was given 2 ml of colostrum with a curved tip syringe and then 10 of formula with a curved tip syringe. Infant continue to suckle for another 10-15 mins. Mother was taught to apply NS and care for shield.  Mother to have father to cup feed infant the rest of his feeding.   Mother to use NS when offering  infant breast, then  supplement after each feeding.   Advised mother of the need to post pump and protect her milk supply  Until her milk comes to volume. Mother has DEBP sat up at bedside.   Mother receptive to all teaching.    Maternal Data    Feeding Feeding Type: Breast Fed  LATCH Score Latch: Grasps breast easily, tongue down, lips flanged, rhythmical sucking.  Audible Swallowing: Spontaneous and intermittent  Type of Nipple: Flat  Comfort (Breast/Nipple): Soft / non-tender  Hold (Positioning): Assistance needed to correctly position infant at breast and maintain latch.  LATCH Score: 8  Interventions Interventions: Assisted with latch;Skin to skin;Hand express;Breast compression;Adjust position;Support pillows;Position options;Shells;DEBP  Lactation Tools Discussed/Used Tools: Nipple Shields Nipple shield size: 24 Breast pump type: Double-Electric Breast Pump   Consult Status Consult Status: Follow-up Date: 06/14/20 Follow-up type: In-patient    Stevan Born  [facility] 06/13/2020, 2:04 PM

## 2020-06-13 NOTE — Discharge Instructions (Signed)

## 2020-06-13 NOTE — Progress Notes (Signed)
Patient has early discharge; however, pediatrician would like to monitor baby until tomorrow at least. Mother would like to remain a patient since today would be an early discharge. Called Dr. Conni Elliot, who ordered that discharge may be cancelled for today. Earl Gala, [facility] [place]

## 2020-06-13 NOTE — Discharge Summary (Signed)
Postpartum Discharge Summary  Date of Service updated 06/13/20    Patient Name: Isabella Stevens DOB: 31-Aug-1986 MRN: [id number]  Date of admission: 06/10/2020 Delivery date:06/11/2020  Delivering provider: Aloha Gell  Date of discharge: 06/13/2020  Admitting diagnosis: Pregnancy [Z34.90] Intrauterine pregnancy: [redacted]w[redacted]d    Secondary diagnosis:  Principal Problem:   Postpartum care following cesarean delivery 9/8 Active Problems:   Oligohydramnios   Encounter for induction of labor   Failed induction of labor   Status post primary low transverse cesarean section 9/8  Additional problems: labile BP's    Discharge diagnosis: Term Pregnancy Delivered                                              Post partum procedures:n/a Augmentation: Cytotec Complications: None  Hospital course: Induction of Labor With Cesarean Section   34y.o. yo G1P0 at 332w0das admitted to the hospital 06/10/2020 for induction of labor. Patient had a labor course significant for oligohydramnios. The patient went for cesarean section due to Arrest of Dilation. Delivery details are as follows: Membrane Rupture Time/Date: 2:54 PM ,06/11/2020   Delivery Method:C-Section, Low Transverse  Details of operation can be found in separate operative Note.  Patient had an uncomplicated postpartum course. She is ambulating, tolerating a regular diet, passing flatus, and urinating well.  Patient is discharged home in stable condition on 06/13/20.      Newborn Data: Birth date:06/11/2020  Birth time:6:49 PM  Gender:Female  Living status:Living  Apgars:8 ,8  Weight:3160 g                                 Magnesium Sulfate received: No BMZ received: No Rhophylac:N/A MMR:N/A T-DaP:Given prenatally Flu: N/A Transfusion:No  Physical exam  Vitals:   06/12/20 1245 06/12/20 1707 06/12/20 2311 06/13/20 0540  BP: 109/63 134/86  128/62  Pulse: 60 69  64  Resp: 18 18 20 16   Temp: 97.9 F (36.6 C) 98.4 F (36.9 C)  98 F  (36.7 C)  TempSrc: Oral Oral  Axillary  SpO2: 100% 100%  98%  Weight:      Height:       General: alert Lochia: appropriate Uterine Fundus: firm Incision: Dressing is clean, dry, and intact DVT Evaluation: No evidence of DVT seen on physical exam. Labs: Lab Results  Component Value Date   WBC 18.3 (H) 06/12/2020   HGB 10.3 (L) 06/12/2020   HCT 31.1 (L) 06/12/2020   MCV 93.1 06/12/2020   PLT 327 06/12/2020   CMP Latest Ref Rng & Units 07/17/2016  Glucose 65 - 99 mg/dL 94  BUN 6 - 20 mg/dL 10  Creatinine 0.44 - 1.00 mg/dL 0.64  Sodium 135 - 145 mmol/L 136  Potassium 3.5 - 5.1 mmol/L 3.9  Chloride 101 - 111 mmol/L 102  CO2 22 - 32 mmol/L 26  Calcium 8.9 - 10.3 mg/dL 9.4   Edinburgh Score: Edinburgh Postnatal Depression Scale Screening Tool 06/12/2020  I have been able to laugh and see the funny side of things. 0  I have looked forward with enjoyment to things. 0  I have blamed myself unnecessarily when things went wrong. 1  I have been anxious or worried for no good reason. 1  I have felt scared or panicky for no good reason.  0  Things have been getting on top of me. 1  I have been so unhappy that I have had difficulty sleeping. 0  I have felt sad or miserable. 1  I have been so unhappy that I have been crying. 0  The thought of harming myself has occurred to me. 0  Edinburgh Postnatal Depression Scale Total 4      After visit meds:  Allergies as of 06/13/2020      Reactions   Sulfa Antibiotics    Skin reaction      Medication List    STOP taking these medications   omeprazole 20 MG capsule Commonly known as: PRILOSEC     TAKE these medications   acetaminophen 500 MG tablet Commonly known as: TYLENOL Take 500 mg by mouth every 6 (six) hours as needed for mild pain or headache.   ibuprofen 800 MG tablet Commonly known as: ADVIL Take 1 tablet (800 mg total) by mouth every 8 (eight) hours.   prenatal multivitamin Tabs tablet Take 1 tablet by mouth daily  at 12 noon.   senna-docusate 8.6-50 MG tablet Commonly known as: Senokot-S Take 2 tablets by mouth at bedtime as needed for mild constipation.            Discharge Care Instructions  (From admission, onward)         Start     Ordered   06/13/20 0000  Discharge wound care:       Comments: Sitz baths 2 times /day with warm water x 1 week. May add herbals: 1 ounce dried comfrey leaf* 1 ounce calendula flowers 1 ounce lavender flowers  Supplies can be found online at [employer] sources at [employer], [employer]  1/2 ounce dried uva ursi leaves 1/2 ounce witch hazel blossoms (if you can find them) 1/2 ounce dried sage leaf 1/2 cup sea salt Directions: Bring 2 quarts of water to a boil. Turn off heat, and place 1 ounce (approximately 1 large handful) of the above mixed herbs (not the salt) into the pot. Steep, covered, for 30 minutes.  Strain the liquid well with a fine mesh strainer, and discard the herb material. Add 2 quarts of liquid to the tub, along with the 1/2 cup of salt. This medicinal liquid can also be made into compresses and peri-rinses.   06/13/20 1004           Discharge home in stable condition Infant Feeding: Breast Infant Disposition:home with mother Discharge instruction: per After Visit Summary and Postpartum booklet. Activity: Advance as tolerated. Pelvic rest for 6 weeks.  Diet: routine diet Anticipated Birth Control: Unsure Postpartum Appointment: 6 weeks Additional Postpartum F/U: BP check 1 week Future Appointments: Future Appointments  Date Time Provider [place]  06/16/2020  9:45 AM [facility]-SCREENING [facility] None   Follow up Visit:      06/13/2020 Jalysa Swopes A Avabella Wailes, DO

## 2020-06-14 NOTE — Lactation Note (Signed)
This note was copied from a baby's chart. Lactation Consultation Note  Patient Name: [name] [id number] Date: 06/14/2020 Reason for consult: Follow-up assessment   Mother reports that infant stayed in the nursery last night so she could rest. Mother reports that she was able to latch infant with NS once for 10 mins. Mother has been pumping and last pumped 10 ml. Mother has been bottle feeding formula .  Mother has a lansinoh pump at home. She plans to pump every 2-3 hours for 15-20 mins. Mother advised to do good breast massage and hand expression.  Discussed treatment and prevention of engorgement.   Mother to continue to cue base feed infant and feed at least 8-12 times or more in 24 hours and advised to allow for cluster feeding infant as needed.  Mother to follow up with OP LC. Mother to continue to due STS. Mother is aware of available LC services at [facility], BFSG'S, OP Dept, and phone # for questions or concerns about breastfeeding.  Mother receptive to all teaching and plan of care.     Maternal Data    Feeding Feeding Type: Bottle Fed - Formula Nipple Type: Slow - flow  LATCH Score                   Interventions Interventions: Skin to skin  Lactation Tools Discussed/Used     Consult Status Consult Status: Complete    Isabella Stevens 06/14/2020, 11:54 AM

## 2020-06-14 NOTE — Progress Notes (Signed)
POSTOPERATIVE DAY # 3 S/P Primary LTCS for arrest of dilation, baby girl "[place]"  S:         Reports feeling well, no complaints              Tolerating po intake /(+) nausea and vomiting last night, but none today / no flatus / no BM  Denies dizziness, SOB, or CP             Bleeding is light             Pain controlled withTylenol and Toradol             Up with assistance/ RN in room to remove foley catheter  Newborn breast and formula feeding - working with lactation   O:  VS: BP 136/64 (BP Location: Right Arm)    Pulse 81    Temp 98.8 F (37.1 C) (Oral)    Resp 16    Ht 5\' 2"  (1.575 m)    Wt 122.5 kg    SpO2 100%    BMI 49.38 kg/m   Patient Vitals for the past 24 hrs:  BP Temp Temp src Pulse Resp SpO2  06/14/20 0536 136/64 98.8 F (37.1 C) Oral 81 16 --  06/13/20 2058 138/66 99 F (37.2 C) Oral 83 17 100 %  06/13/20 2040 129/67 98.7 F (37.1 C) Oral 82 18 --  06/13/20 1354 135/62 99.4 F (37.4 C) Oral 84 16 --     LABS:               Recent Labs    06/12/20 0525  WBC 18.3*  HGB 10.3*  PLT 327               Bloodtype: --/--/O POS (09/07 1840)  Rubella: Immune (02/26 0000)                                             I&O: Intake/Output      09/10 0701 - 09/11 0700 09/11 0701 - 09/12 0700   Urine (mL/kg/hr)     Total Output     Net                       Physical Exam:             Alert and Oriented X3  Lungs: Clear and unlabored  Heart: regular rate and rhythm / no murmurs  Abdomen: soft, non-tender,obese, moderate gaseous distention, active bowel sounds             Fundus: firm, non-tender, U-1             Dressing: honeycomb with steri-strips, scant drainage              Incision:  approximated with sutures / no erythema / no ecchymosis / no active drainage  Perineum: intact; foley catheter in place  Lochia: small lochia on pad  Extremities: trace LE edema, no calf pain or tenderness  A/P:     POD # 3 S/P Primary LTCS             ABL Anemia compounding  chronic IDA   - begin Niferex 150mg  PO daily when bowel function improved   - Magnesium oxide 400mg  PO daily   Maternal ovarian cyst   - f/u PP  Labile BPs yesterday   -  stable today; no neural s/s   - watch closely for PP HTN/ PEC; would recommend BP check in 1 week if remains stable    Routine postoperative care              Warm liquids and ambulation encouraged to promote bowel motility  Lactation support PRN   Continue current care  DC home today

## 2020-06-16 ENCOUNTER — Other Ambulatory Visit (HOSPITAL_COMMUNITY): Payer: 59

## 2020-06-17 ENCOUNTER — Inpatient Hospital Stay (HOSPITAL_COMMUNITY): Payer: 59

## 2020-06-17 ENCOUNTER — Inpatient Hospital Stay (HOSPITAL_COMMUNITY): Admission: AD | Admit: 2020-06-17 | Payer: 59 | Source: Home / Self Care | Admitting: Obstetrics and Gynecology

## 2020-07-15 ENCOUNTER — Emergency Department (HOSPITAL_COMMUNITY): Payer: 59

## 2020-07-15 ENCOUNTER — Encounter (HOSPITAL_COMMUNITY): Payer: Self-pay | Admitting: Pharmacy Technician

## 2020-07-15 ENCOUNTER — Other Ambulatory Visit: Payer: Self-pay

## 2020-07-15 ENCOUNTER — Emergency Department (HOSPITAL_COMMUNITY)
Admission: EM | Admit: 2020-07-15 | Discharge: 2020-07-16 | Disposition: A | Discharge status: Home / Self Care | Payer: 59 | Attending: Emergency Medicine | Admitting: Emergency Medicine

## 2020-07-15 DIAGNOSIS — Z20822 Contact with and (suspected) exposure to covid-19: Secondary | ICD-10-CM | POA: Insufficient documentation

## 2020-07-15 DIAGNOSIS — R0609 Other forms of dyspnea: Secondary | ICD-10-CM | POA: Insufficient documentation

## 2020-07-15 DIAGNOSIS — I10 Essential (primary) hypertension: Secondary | ICD-10-CM | POA: Diagnosis not present

## 2020-07-15 DIAGNOSIS — R0602 Shortness of breath: Secondary | ICD-10-CM | POA: Diagnosis present

## 2020-07-15 DIAGNOSIS — R5383 Other fatigue: Secondary | ICD-10-CM | POA: Diagnosis not present

## 2020-07-15 LAB — HEPATIC FUNCTION PANEL
ALT: 8 U/L (ref 0–44)
AST: 11 U/L — ABNORMAL LOW (ref 15–41)
Albumin: 3.4 g/dL — ABNORMAL LOW (ref 3.5–5.0)
Alkaline Phosphatase: 76 U/L (ref 38–126)
Bilirubin, Direct: <0.1 mg/dL (ref 0.0–0.2)
Total Bilirubin: 0.1 mg/dL — ABNORMAL LOW (ref 0.3–1.2)
Total Protein: 7.5 g/dL (ref 6.5–8.1)

## 2020-07-15 LAB — URINALYSIS, ROUTINE W REFLEX MICROSCOPIC
Bilirubin Urine: NEGATIVE
Glucose, UA: NEGATIVE mg/dL
Hgb urine dipstick: NEGATIVE
Ketones, ur: NEGATIVE mg/dL
Nitrite: NEGATIVE
Protein, ur: NEGATIVE mg/dL
Specific Gravity, Urine: 1.013 (ref 1.005–1.030)
pH: 7 (ref 5.0–8.0)

## 2020-07-15 LAB — BASIC METABOLIC PANEL
Anion gap: 9 (ref 5–15)
BUN: 10 mg/dL (ref 6–20)
CO2: 27 mmol/L (ref 22–32)
Calcium: 9.2 mg/dL (ref 8.9–10.3)
Chloride: 104 mmol/L (ref 98–111)
Creatinine, Ser: 0.8 mg/dL (ref 0.44–1.00)
GFR, Estimated: >60 mL/min (ref >60)
Glucose, Bld: 83 mg/dL (ref 70–99)
Potassium: 3.9 mmol/L (ref 3.5–5.1)
Sodium: 140 mmol/L (ref 135–145)

## 2020-07-15 LAB — RESPIRATORY PANEL BY RT PCR (FLU A&B, COVID)
Influenza A by PCR: NEGATIVE
Influenza B by PCR: NEGATIVE
SARS Coronavirus 2 by RT PCR: NEGATIVE

## 2020-07-15 LAB — CBC
HCT: 38.1 % (ref 36.0–46.0)
Hemoglobin: 11.8 g/dL — ABNORMAL LOW (ref 12.0–15.0)
MCH: 28.8 pg (ref 26.0–34.0)
MCHC: 31 g/dL (ref 30.0–36.0)
MCV: 92.9 fL (ref 80.0–100.0)
Platelets: 458 10*3/uL — ABNORMAL HIGH (ref 150–400)
RBC: 4.1 MIL/uL (ref 3.87–5.11)
RDW: 12.7 % (ref 11.5–15.5)
WBC: 6.5 10*3/uL (ref 4.0–10.5)
nRBC: 0 % (ref 0.0–0.2)

## 2020-07-15 LAB — I-STAT BETA HCG BLOOD, ED (MC, WL, AP ONLY): I-stat hCG, quantitative: <5 m[IU]/mL (ref <5)

## 2020-07-15 LAB — BRAIN NATRIURETIC PEPTIDE: B Natriuretic Peptide: 16.8 pg/mL (ref 0.0–100.0)

## 2020-07-15 LAB — D-DIMER, QUANTITATIVE: D-Dimer, Quant: 0.78 ug/mL-FEU — ABNORMAL HIGH (ref 0.00–0.50)

## 2020-07-15 LAB — TROPONIN I (HIGH SENSITIVITY): Troponin I (High Sensitivity): 3 ng/L (ref <18)

## 2020-07-15 MED ORDER — IOHEXOL 350 MG/ML SOLN
75.0000 mL | Freq: Once | INTRAVENOUS | Status: AC | PRN
  Administered 2020-07-15: 75 mL via INTRAVENOUS

## 2020-07-15 MED ORDER — NIFEDIPINE ER OSMOTIC RELEASE 30 MG PO TB24
30.0000 mg | ORAL_TABLET | Freq: Once | ORAL | Status: AC
  Administered 2020-07-16: 30 mg via ORAL
  Filled 2020-07-15: qty 1

## 2020-07-15 NOTE — ED Triage Notes (Signed)
Pt here with increasing fatigue, shob and near syncope. Pt reports fatigue has been going on several days but developed shob yesterday.

## 2020-07-15 NOTE — Discharge Instructions (Addendum)
Your OB office should contact you for close follow-up.  They recommend that we start you on a blood pressure medicine this was been called into your pharmacy.  Please return to the emergency department if any worsening or concerning symptoms.

## 2020-07-15 NOTE — ED Notes (Signed)
Pt ambulated to bathroom independently

## 2020-07-15 NOTE — ED Provider Notes (Signed)
[facility] Provider Note   CSN: [id number] Arrival date & time: 07/15/20  1648     History No chief complaint on file.   Isabella Miyamotolexis Turpen is a 34 y.o. female.  She has no significant past medical history.  She gave birth via C-section last month.  She has been doing fairly well although for the last few days she has felt more fatigued.  Since yesterday she has noticed that she is short of breath when climbing the stairs.  No cough no chest pain no fevers or chills no abdominal pain vomiting diarrhea or urinary symptoms.  No history of high blood pressure other than she said postoperatively her blood pressure was high after the surgery but improved before discharge.  Has not had her postnatal follow-up.  The history is provided by the patient.  Shortness of Breath Severity:  Moderate Onset quality:  Gradual Duration:  2 days Timing:  Intermittent Progression:  Unchanged Chronicity:  New Context: activity   Relieved by:  Rest Worsened by:  Activity Ineffective treatments:  None tried Associated symptoms: no abdominal pain, no chest pain, no cough, no fever, no headaches, no neck pain, no rash, no sore throat and no vomiting   Risk factors: recent surgery        Past Medical History:  Diagnosis Date  . Complex ovarian cyst   . Marginal insertion of umbilical cord affecting management of mother     Patient Active Problem List   Diagnosis Date Noted  . Oligohydramnios 06/11/2020  . Encounter for induction of labor 06/11/2020  . Failed induction of labor 06/11/2020  . Status post primary low transverse cesarean section 9/8 06/11/2020  . Postpartum care following cesarean delivery 9/8 06/11/2020    Past Surgical History:  Procedure Laterality Date  . CESAREAN SECTION N/A 06/11/2020   Procedure: CESAREAN SECTION;  Surgeon: Noland FordyceFogleman, Kelly, MD;  Location: [facility] LD ORS;  Service: Obstetrics;  Laterality: N/A;     OB History    Gravida  1     Para      Term      Preterm      AB      Living        SAB      TAB      Ectopic      Multiple      Live Births              Family History  Problem Relation Age of Onset  . Anxiety disorder Mother   . Arthritis Mother   . Hypertension Mother   . Obesity Mother   . Diabetes Maternal Aunt   . Heart disease Maternal Aunt   . Hypertension Maternal Aunt   . Kidney disease Maternal Aunt   . Obesity Maternal Aunt   . Cancer Maternal Grandmother   . Stroke Maternal Grandmother     Social History   Tobacco Use  . Smoking status: Never Smoker  . Smokeless tobacco: Never Used  Substance Use Topics  . Alcohol use: Yes    Comment: socially  . Drug use: No    Home Medications Prior to Admission medications   Medication Sig Start Date End Date Taking? Authorizing Provider  acetaminophen (TYLENOL) 500 MG tablet Take 500 mg by mouth every 6 (six) hours as needed for mild pain or headache.    [provider]  ibuprofen (ADVIL) 800 MG tablet Take 1 tablet (800 mg total) by mouth every 8 (  eight) hours. 06/13/20   Law, Cassandra A, DO  Prenatal Vit-Fe Fumarate-FA (PRENATAL MULTIVITAMIN) TABS tablet Take 1 tablet by mouth daily at 12 noon.    [provider]  senna-docusate (SENOKOT-S) 8.6-50 MG tablet Take 2 tablets by mouth at bedtime as needed for mild constipation. 06/13/20   Law, Cassandra A, DO    Allergies    Sulfa antibiotics  Review of Systems   Review of Systems  Constitutional: Positive for fatigue. Negative for fever.  HENT: Negative for sore throat.   Eyes: Negative for visual disturbance.  Respiratory: Positive for shortness of breath. Negative for cough.   Cardiovascular: Negative for chest pain and leg swelling.  Gastrointestinal: Negative for abdominal pain and vomiting.  Genitourinary: Negative for dysuria.  Musculoskeletal: Negative for neck pain.  Skin: Negative for rash.  Neurological: Positive for light-headedness.  Negative for weakness, numbness and headaches.    Physical Exam Updated Vital Signs BP (!) 142/100 (BP Location: Left Arm)   Pulse 83   Temp 99.2 F (37.3 C) (Oral)   Resp 14   Ht 5\' 1"  (1.549 m)   Wt 113.4 kg   SpO2 100%   BMI 47.24 kg/m   Physical Exam Vitals and nursing note reviewed.  Constitutional:      General: She is not in acute distress.    Appearance: Normal appearance. She is well-developed.  HENT:     Head: Normocephalic and atraumatic.  Eyes:     Conjunctiva/sclera: Conjunctivae normal.  Cardiovascular:     Rate and Rhythm: Normal rate and regular rhythm.     Heart sounds: No murmur heard.   Pulmonary:     Effort: Pulmonary effort is normal. No respiratory distress.     Breath sounds: Normal breath sounds.  Abdominal:     Palpations: Abdomen is soft.     Tenderness: There is no abdominal tenderness.  Musculoskeletal:        General: No tenderness. Normal range of motion.     Cervical back: Neck supple.     Right lower leg: No edema.     Left lower leg: No edema.  Skin:    General: Skin is warm and dry.     Capillary Refill: Capillary refill takes less than 2 seconds.  Neurological:     Mental Status: She is alert.     Sensory: No sensory deficit.     Motor: No weakness.     ED Results / Procedures / Treatments   Labs (all labs ordered are listed, but only abnormal results are displayed) Labs Reviewed  CBC - Abnormal; Notable for the following components:      Result Value   Hemoglobin 11.8 (*)    Platelets 458 (*)    All other components within normal limits  HEPATIC FUNCTION PANEL - Abnormal; Notable for the following components:   Albumin 3.4 (*)    AST 11 (*)    Total Bilirubin 0.1 (*)    All other components within normal limits  URINALYSIS, ROUTINE W REFLEX MICROSCOPIC - Abnormal; Notable for the following components:   APPearance HAZY (*)    Leukocytes,Ua MODERATE (*)    Bacteria, UA RARE (*)    All other components within normal  limits  D-DIMER, QUANTITATIVE (NOT AT [facility]) - Abnormal; Notable for the following components:   D-Dimer, Quant 0.78 (*)    All other components within normal limits  RESPIRATORY PANEL BY RT PCR (FLU A&B, COVID)  BASIC METABOLIC PANEL  BRAIN NATRIURETIC PEPTIDE  I-STAT BETA HCG BLOOD, ED ([facility], [facility], [facility] ONLY)  TROPONIN I (HIGH SENSITIVITY)    EKG EKG Interpretation  Date/Time:  Tuesday July 15 2020 20:26:05 EDT Ventricular Rate:  79 PR Interval:  156 QRS Duration: 92 QT Interval:  375 QTC Calculation: 430 R Axis:   40 Text Interpretation: Sinus rhythm ST elevation, consider lateral injury No significant change since prior today Confirmed by Meridee Score [phone]) on 07/15/2020 9:02:27 PM Also confirmed by Meridee Score [phone]), editor Ronnie Derby [phone])  on 07/16/2020 7:45:22 AM   Radiology DG Chest 2 View  Result Date: 07/15/2020 CLINICAL DATA:  34 year old female with shortness of breath. EXAM: CHEST - 2 VIEW COMPARISON:  Chest radiograph dated 07/17/2016. FINDINGS: The heart size and mediastinal contours are within normal limits. Both lungs are clear. The visualized skeletal structures are unremarkable. IMPRESSION: No active cardiopulmonary disease. Electronically Signed   By: Elgie Collard M.D.   On: 07/15/2020 17:36   CT Angio Chest PE W/Cm &/Or Wo Cm  Result Date: 07/15/2020 CLINICAL DATA:  Pulmonary embolism, dyspnea, elevated D-dimer EXAM: CT ANGIOGRAPHY CHEST WITH CONTRAST TECHNIQUE: Multidetector CT imaging of the chest was performed using the standard protocol during bolus administration of intravenous contrast. Multiplanar CT image reconstructions and MIPs were obtained to evaluate the vascular anatomy. CONTRAST:  41mL OMNIPAQUE IOHEXOL 350 MG/ML SOLN COMPARISON:  None. FINDINGS: Cardiovascular: Satisfactory opacification of the pulmonary arteries to the segmental level. No evidence of pulmonary embolism. Normal heart size. No pericardial effusion. The thoracic  aorta is unremarkable save for bovine arch anatomy. Mediastinum/Nodes: No enlarged mediastinal, hilar, or axillary lymph nodes. Thyroid gland, trachea, and esophagus demonstrate no significant findings. Lungs/Pleura: Lungs are clear. No pleural effusion or pneumothorax. Upper Abdomen: No acute abnormality. Musculoskeletal: No chest wall abnormality. No acute or significant osseous findings. Review of the MIP images confirms the above findings. IMPRESSION: No pulmonary embolism.  Normal examination of the chest. Electronically Signed   By: Helyn Numbers MD   On: 07/15/2020 22:54    Procedures Ultrasound ED Echo  Date/Time: 07/15/2020 9:00 PM Performed by: Terrilee Files, MD Authorized by: Terrilee Files, MD   Procedure details:    Indications: dyspnea     Views: parasternal long axis view, parasternal short axis view and apical 4 chamber view     Images: archived   Findings:    Pericardium: no pericardial effusion     LV Function: normal (>50% EF)   Impression:    Impression: normal     (including critical care time)  Medications Ordered in ED Medications  iohexol (OMNIPAQUE) 350 MG/ML injection 75 mL (75 mLs Intravenous Contrast Given 07/15/20 2244)  NIFEdipine (PROCARDIA-XL/NIFEDICAL-XL) 24 hr tablet 30 mg (30 mg Oral Given 07/16/20 0041)    ED Course  I have reviewed the triage vital signs and the nursing notes.  Pertinent labs & imaging results that were available during my care of the patient were reviewed by me and considered in my medical decision making (see chart for details).  Clinical Course as of Jul 16 1058  Tue Jul 15, 2020  2102 EKG showing normal sinus rhythm rate of 79 normal intervals no acute ST-T changes similar to 10/17   [MB]  2344 Patient did trending pulse ox and did not have any significant symptoms.  Heart rate stayed around 90s and no desaturation.  Blood pressure remains elevated 154/87.   [MB]  2352 Glucose, UA: NEGATIVE [MB]  2359 Discussed  with [facility] on-call physician Dr.  Fogelman.  We went over the differential and I relayed the lab work and CT chest imaging with her.  She felt the patient was adequately worked up and could follow-up in the office tomorrow.  She asked that I start the patient on nifedipine 30 mg daily.  I reviewed this with the patient and she is comfortable with plan.   [MB]    Clinical Course User Index [MB] Terrilee Files, MD   MDM Rules/Calculators/A&P                         This patient complains of fatigue and shortness of breath; this involves an extensive number of treatment Options and is a complaint that carries with it a high risk of complications and Morbidity. The differential includes preeclampsia, Covid, PE, anemia, cardiomyopathy, pneumonia  I ordered, reviewed and interpreted labs, which included CBC with normal white count, low hemoglobin but better than discharge hemoglobin, chemistries normal, urine with 11-20 whites but 6-10 epis likely contaminated, urine does show no protein, LFTs not elevated, pregnancy test negative, D-dimer is elevated.  Troponin unremarkable.  Covid testing and flu testing negative.   I ordered medication nifedipine per recommendations of Dr. Algie Coffer for patient's elevated blood pressures I ordered imaging studies which included chest x-ray and CT PE and I independently    visualized and interpreted imaging which showed no acute findings no pericardial effusion Previous records obtained and reviewed in epic including prior operative note I consulted OB Dr. Algie Coffer and discussed lab and imaging findings  Critical Interventions: None  After the interventions stated above, I reevaluated the patient and found patient to be minimally symptomatic.  She is hypertensive.  She ambulated in the department without any difficulty and did not become hypoxic or tachycardic.  With her benign work-up here I feel she is safe for discharge.  Per recommendations of OB we will  start her on nifedipine and they arrange for close follow-up in the office.  Return instructions discussed.  Final Clinical Impression(s) / ED Diagnoses Final diagnoses:  Hypertension, unspecified type  DOE (dyspnea on exertion)    Rx / DC Orders ED Discharge Orders         Ordered    NIFEdipine (PROCARDIA-XL/NIFEDICAL-XL) 30 MG 24 hr tablet  Daily        07/16/20 0000           Terrilee Files, MD 07/16/20 1102

## 2020-07-16 MED ORDER — NIFEDIPINE ER OSMOTIC RELEASE 30 MG PO TB24: 30.0000 mg | ORAL_TABLET | Freq: Every day | ORAL | 0 refills | Status: AC

## 2021-09-14 IMAGING — CT CT ANGIO CHEST
2 of 6 series · 19 of 36 positions shown · IV contrast (omnipaque)
Comparison: None.

CLINICAL DATA: Pulmonary embolism, dyspnea, elevated D-dimer

EXAM:
CT ANGIOGRAPHY CHEST WITH CONTRAST
TECHNIQUE: Multidetector CT imaging of the chest was performed using the
standard protocol during bolus administration of intravenous
contrast. Multiplanar CT image reconstructions and MIPs were
obtained to evaluate the vascular anatomy.
CONTRAST:  75mL OMNIPAQUE IOHEXOL 350 MG/ML SOLN

[Series 7: pe thins · axial · 0.94mm/px · z∈[+1066,+1264]mm · 18 of 314 slices shown]
[im 16/314  lung]
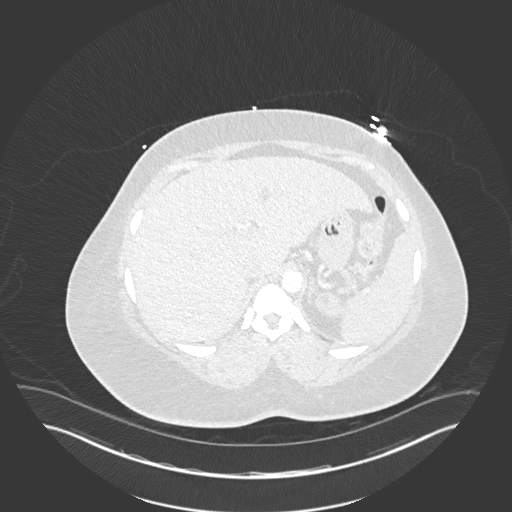
[im 32/314  mediastinal]
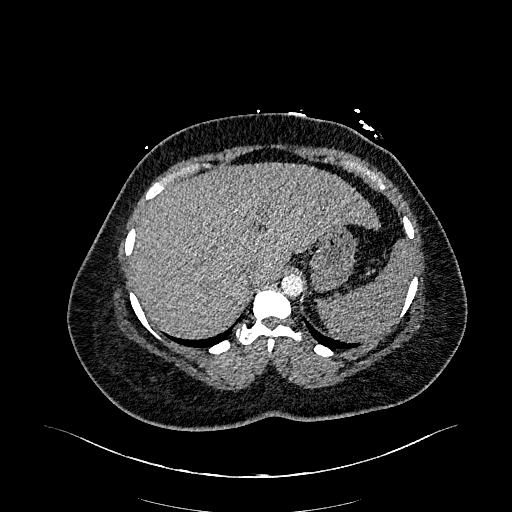
[im 47/314  lung]
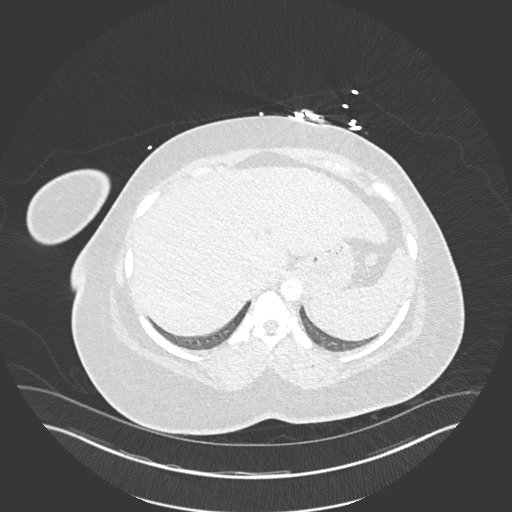
[im 63/314  mediastinal]
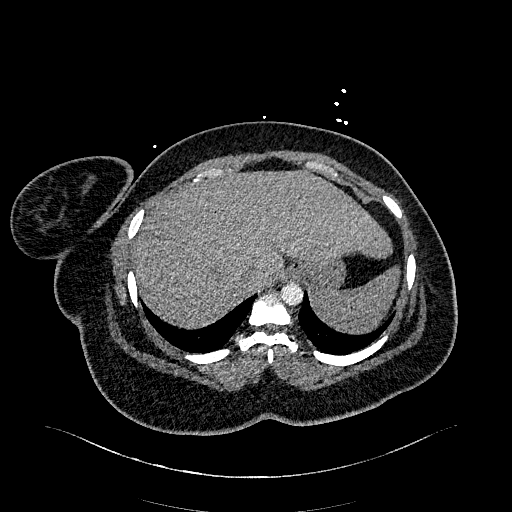
[im 79/314  lung]
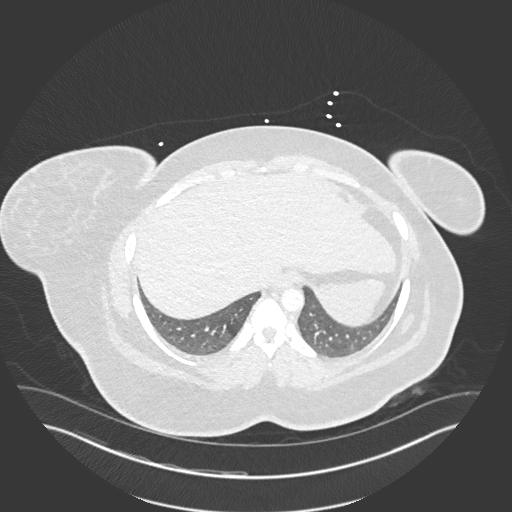
[im 94/314  mediastinal]
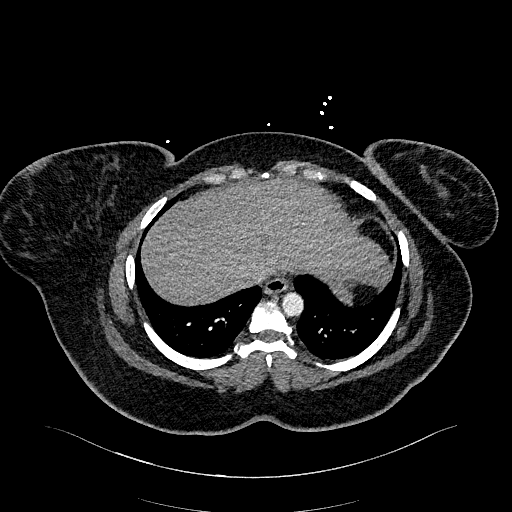
[im 110/314  lung]
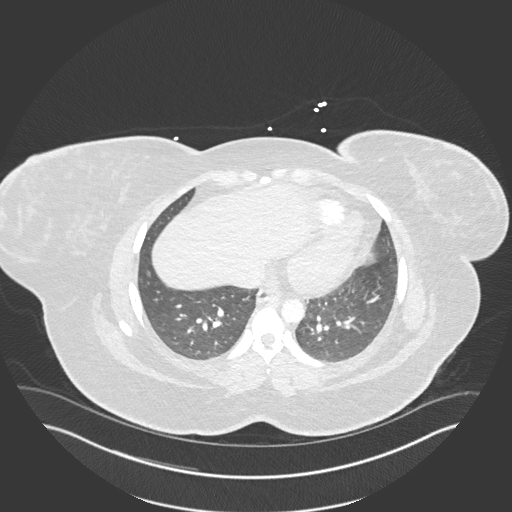
[im 126/314  mediastinal]
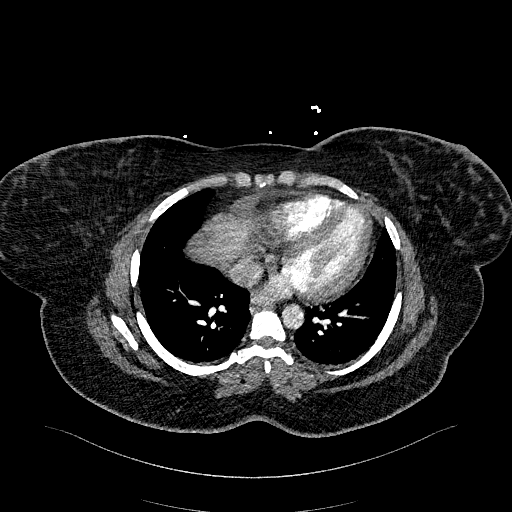
[im 141/314  lung]
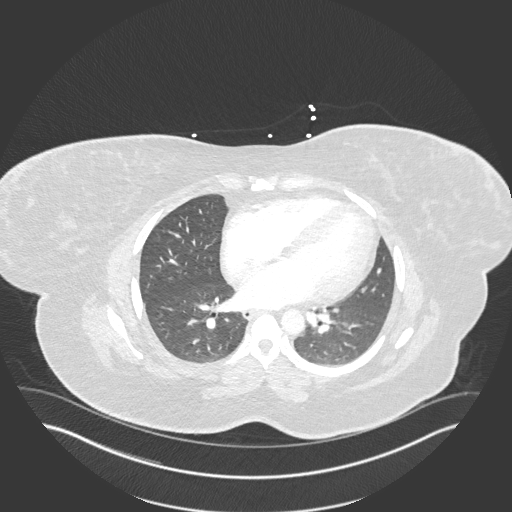
[im 173/314  mediastinal]
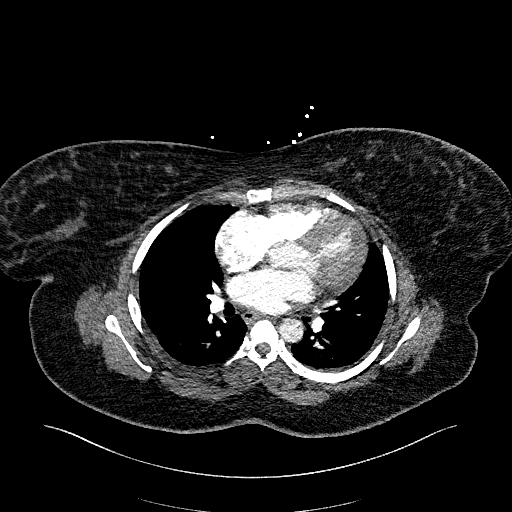
[im 188/314  lung]
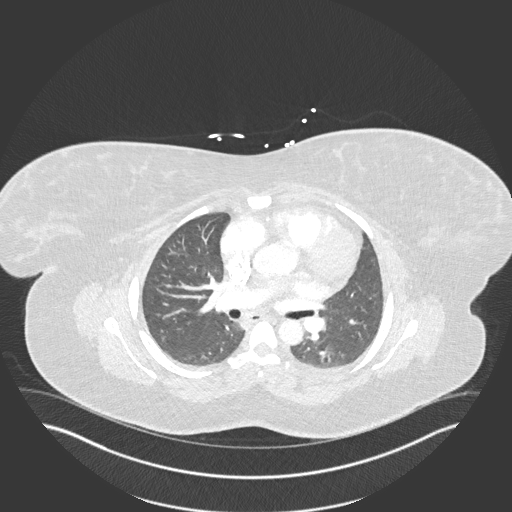
[im 204/314  mediastinal]
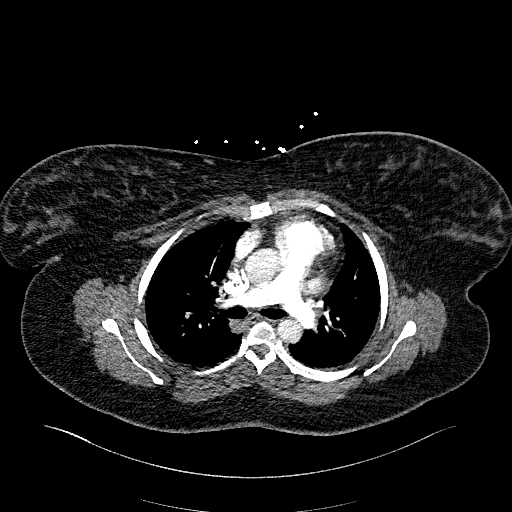
[im 220/314  lung]
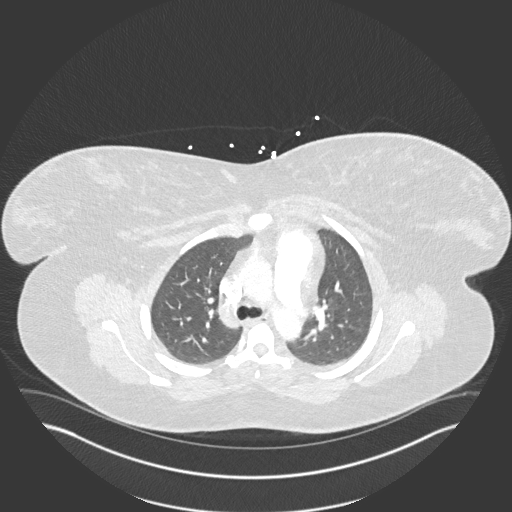
[im 235/314  mediastinal]
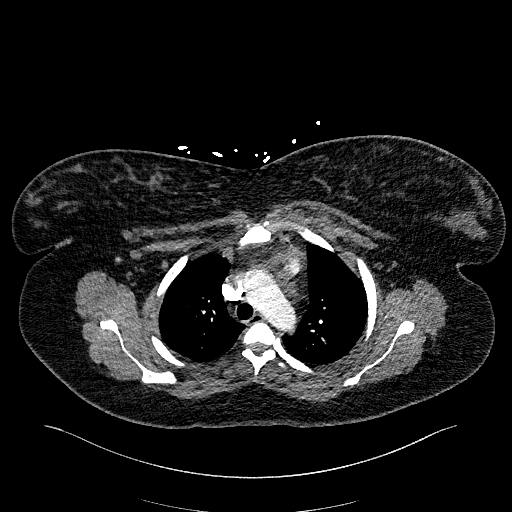
[im 251/314  lung]
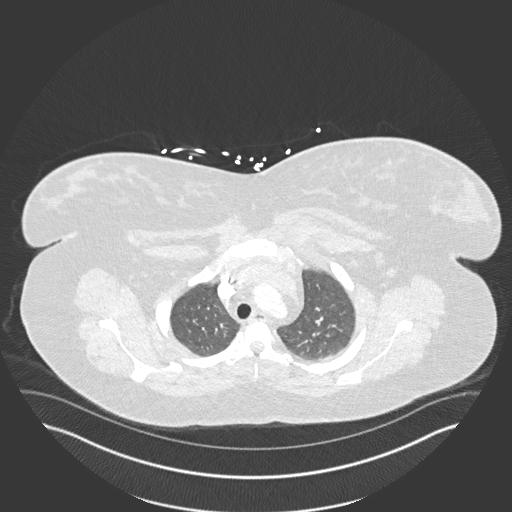
[im 267/314  mediastinal]
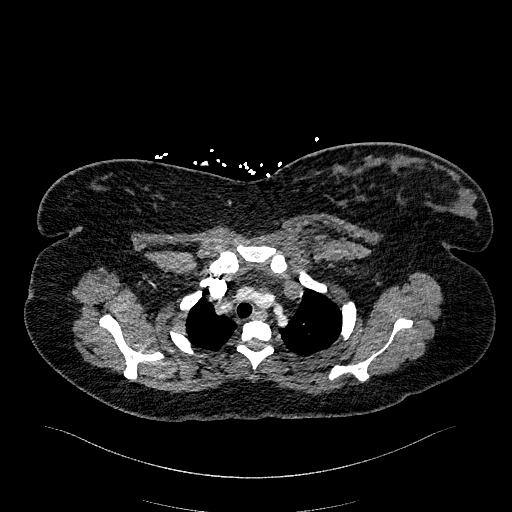
[im 282/314  lung]
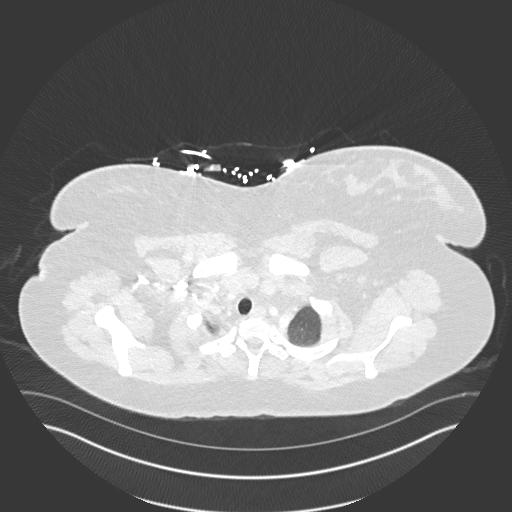
[im 298/314  mediastinal]
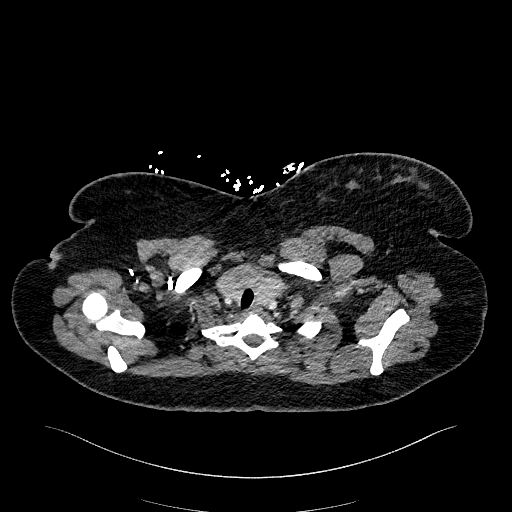

[Series 8: pe 2mm cor · coronal · 0.44mm/px · 1 of 130 slices shown]
[im 65/130  mediastinal]
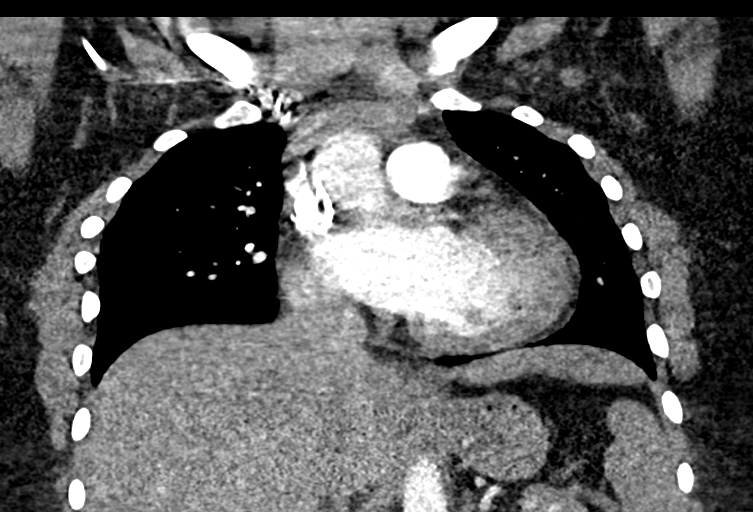

[19 of 36 positions shown; findings below may reference images not displayed]

FINDINGS: Cardiovascular: Satisfactory opacification of the pulmonary arteries
to the segmental level. No evidence of pulmonary embolism. Normal
heart size. No pericardial effusion. The thoracic aorta is
unremarkable save for bovine arch anatomy.

Mediastinum/Nodes: No enlarged mediastinal, hilar, or axillary lymph
nodes. Thyroid gland, trachea, and esophagus demonstrate no
significant findings.

Lungs/Pleura: Lungs are clear. No pleural effusion or pneumothorax.

Upper Abdomen: No acute abnormality.

Musculoskeletal: No chest wall abnormality. No acute or significant
osseous findings.

Review of the MIP images confirms the above findings.
IMPRESSION: No pulmonary embolism.  Normal examination of the chest.

## 2022-05-11 ENCOUNTER — Other Ambulatory Visit: Payer: Self-pay | Admitting: Chiropractic Medicine

## 2022-05-11 DIAGNOSIS — M545 Low back pain, unspecified: Secondary | ICD-10-CM

## 2022-05-22 ENCOUNTER — Ambulatory Visit
Admission: RE | Admit: 2022-05-22 | Discharge: 2022-05-22 | Disposition: A | Discharge status: Home / Self Care | Payer: Self-pay | Source: Ambulatory Visit | Attending: Chiropractic Medicine | Admitting: Chiropractic Medicine

## 2022-05-22 DIAGNOSIS — G8929 Other chronic pain: Secondary | ICD-10-CM
